# Patient Record
Sex: Female | Born: 1980 | Race: Black or African American | Hispanic: No | Marital: Single | Smoking: Former smoker
Health system: Southern US, Community
[De-identification: ages and names within clinical notes are randomized; demographics above are authoritative.]

## PROBLEM LIST (undated history)

## (undated) ENCOUNTER — Inpatient Hospital Stay (HOSPITAL_COMMUNITY): Payer: Self-pay

## (undated) DIAGNOSIS — O24419 Gestational diabetes mellitus in pregnancy, unspecified control: Secondary | ICD-10-CM

## (undated) DIAGNOSIS — Z8619 Personal history of other infectious and parasitic diseases: Secondary | ICD-10-CM

## (undated) DIAGNOSIS — D649 Anemia, unspecified: Secondary | ICD-10-CM

## (undated) DIAGNOSIS — J45909 Unspecified asthma, uncomplicated: Secondary | ICD-10-CM

## (undated) DIAGNOSIS — O09529 Supervision of elderly multigravida, unspecified trimester: Secondary | ICD-10-CM

## (undated) DIAGNOSIS — O34219 Maternal care for unspecified type scar from previous cesarean delivery: Secondary | ICD-10-CM

## (undated) HISTORY — DX: Personal history of other infectious and parasitic diseases: Z86.19

## (undated) HISTORY — DX: Supervision of elderly multigravida, unspecified trimester: O09.529

## (undated) HISTORY — DX: Anemia, unspecified: D64.9

## (undated) HISTORY — DX: Gestational diabetes mellitus in pregnancy, unspecified control: O24.419

---
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
## 2006-03-02 ENCOUNTER — Other Ambulatory Visit: Admission: RE | Admit: 2006-03-02 | Discharge: 2006-03-02 | Payer: Self-pay | Admitting: Obstetrics and Gynecology

## 2006-07-05 ENCOUNTER — Encounter: Admission: RE | Admit: 2006-07-05 | Discharge: 2006-07-05 | Payer: Self-pay | Admitting: Obstetrics and Gynecology

## 2006-08-05 ENCOUNTER — Inpatient Hospital Stay (HOSPITAL_COMMUNITY): Admission: RE | Admit: 2006-08-05 | Discharge: 2006-08-08 | Payer: Self-pay | Admitting: Obstetrics and Gynecology

## 2009-03-04 ENCOUNTER — Emergency Department (HOSPITAL_COMMUNITY): Admission: EM | Admit: 2009-03-04 | Discharge: 2009-03-04 | Payer: Self-pay | Admitting: Emergency Medicine

## 2010-04-23 ENCOUNTER — Emergency Department (HOSPITAL_COMMUNITY): Admission: EM | Admit: 2010-04-23 | Discharge: 2010-04-23 | Payer: Self-pay | Admitting: Emergency Medicine

## 2011-01-12 LAB — WET PREP, GENITAL
Trich, Wet Prep: NONE SEEN
Yeast Wet Prep HPF POC: NONE SEEN

## 2011-01-12 LAB — URINALYSIS, ROUTINE W REFLEX MICROSCOPIC
Bilirubin Urine: NEGATIVE
Glucose, UA: NEGATIVE mg/dL
Hgb urine dipstick: NEGATIVE
Nitrite: NEGATIVE
Urobilinogen, UA: 1 mg/dL (ref 0.0–1.0)

## 2011-01-12 LAB — RPR: RPR Ser Ql: NONREACTIVE

## 2011-03-13 NOTE — Discharge Summary (Signed)
NAME:  Carla Marquez, Carla Marquez             ACCOUNT NO.:  [id number]   MEDICAL RECORD NO.:  [id number]            PATIENT TYPE:   LOCATION:                                F[facility]:  [facility]   PHYSICIAN:  Janine Limbo, M.D.DATE OF BIRTH:  06/15/1981   DATE OF ADMISSION:  DATE OF DISCHARGE:                                 DISCHARGE SUMMARY   ADMISSION DIAGNOSIS:  Intrauterine pregnancy at 38-5/7 weeks. Desires repeat  C-section.   DISCHARGE DIAGNOSES:  Intrauterine pregnancy at 38-5/7 weeks delivered.  Repeat low transverse cesarean C-section.   PROCEDURE:  Low transverse cesarean section.   HOSPITAL COURSE:  1. [name] is a 30 year old gravida 3, para 1-0-1-1, who was admitted      at 38-5/7 weeks for repeat low transverse cesarean section. The patient      had been remarkable for previous C-section, failure to progress.  2. History of cervical cryosurgery seven years ago.  3. History of asthma without medication. The patient had elected to      undergo a repeat low transverse cesarean section versus VBAC. Also      pregnancy has been remarkable for empiric diabetes with no insulin      required. The patient underwent a repeat low transverse cesarean      section and delivered a viable female infant. [name] was Apgars of 8      and 9 at 1 and 5 minutes, respectively, and weight 7 pounds 15 ounces.      Surgery was uncomplicated. Estimated blood loss 700 cc. On      postoperative day #1 the patient's hemoglobin was noted to be 7.      However, patient was asymptomatic with orthostatic vital signs within      normal limits. The patient with no syncope. The patient was offered      transfusion of blood secondary to anemia. However, she declined. The      patient is breastfeeding. By day #3 postoperatively the patient was      doing well and would seem to have received full benefit of her hospital      stay. She was discharged home.   DISCHARGE CONDITION:  Stable.   DISCHARGE  INSTRUCTIONS:  Per [facility] handout.   DISCHARGE MEDICATIONS:  1. Motrin 600 mg p.o. q.6 h. p.r.n. pain.  2. Tylox 1-2 tablets p.o. q.4-6 h. p.r.n. pain.  3. Prenatal vitamins 1 tab daily.  4. Tandem F 1 tab b.i.d.  5. Patient was given Depo-Provera 150 mg IM prior to discharge for      contraception.   DISCHARGE FOLLOWUP:  Will occur at six weeks postpartum at [facility]      [name], CNM      Janine Limbo, M.D.  Electronically Signed    NOS/MEDQ  D:  08/08/2006  T:  08/09/2006  Job:  [id number]

## 2011-03-13 NOTE — Op Note (Signed)
NAME:  Carla Marquez, Carla Marquez             ACCOUNT NO.:  [id number]   MEDICAL RECORD NO.:  [id number]          PATIENT TYPE:  INP   LOCATION:  9127                          [facility]ACILITY:  [facility]   PHYSICIAN:  Crist Fat. Rivard, M.D. DATE OF BIRTH:  [birth year]   DATE OF PROCEDURE:  08/05/2006  DATE OF DISCHARGE:                                 OPERATIVE REPORT   PREOPERATIVE DIAGNOSIS:  Intrauterine pregnancy at 38 weeks 5 days with  previous cesarean section, for elective repeat cesarean section.   POSTOPERATIVE DIAGNOSIS:  Intrauterine pregnancy at 38 weeks 5 days with  previous cesarean section, for elective repeat cesarean section.   ANESTHESIA:  Spinal, Burnett Corrente, M.D.   PROCEDURE:  Repeat low transverse cesarean section.   SURGEON:  Crist Fat. Rivard, M.D.   ASSISTANTRenaldo Reel. Latham, C.N.M.   ESTIMATED BLOOD LOSS:  700 mL.   PROCEDURE:  After being informed of the planned procedure with possible  complications including bleeding, infection, injury to bowel, bladder or  ureters, informed consent is obtained.  The patient is taken to cesarean  suite, given spinal anesthesia by Dr. Harvest Forest without complication.  She is  placed in the dorsal decubitus position, pelvis tilted to the left, prepped  and draped in a sterile fashion, and a Foley catheter is inserted in her  bladder.   After assessing adequate level of anesthesia, infiltrate the previous  Pfannenstiel incision using 20 mL of Marcaine 0.25 and we perform a  Pfannenstiel incision, which is brought down sharply to the fascia.  Fascia  is then incised in a low transverse fashion.  Linea alba is dissected with  some difficulty.  We note at this time adhesions between the abdominal wall  and the midbody of the uterus.  Those are sharply dissected progressively  until we are able to identify the lower uterine segment.  We are unable to  identify a bladder flap. But the bladder is easily visualized and retracted  downward, where  we can now safely open the myometrium in a low transverse  fashion, first with knife, then extended bluntly.  Amniotic fluid is clear.  We assist the birth of a female infant at 12:48 p.m. in vertex presentation.  Mouth and nose are suctioned.  Cord is clamped with two Kelly clamps and  sectioned, and the baby is given to the pediatrician, Dr. Katrinka Blazing, present in  the room.  Twenty milliliters of blood is drawn from the umbilical vein, and  the placenta is allowed to deliver spontaneously.  It is complete, cord has  three vessels, and placenta is sent to labor and delivery.  Uterine revision  is negative.  At this time we can identify other adhesions between at the  left lateral pelvic wall and the uterus, and those are sharply dissected,  and this now releases completely the bladder flap.  The myometrium is then  closed in two layers. first with a running locked suture of 0 Vicryl, then  with a Lembert suture of 0 Vicryl imbricating the first one.  A figure-of-  eight stitch of 0 Vicryl is placed midline  to control some bleeding.  Both  paracolic gutters are cleaned, both tubes and ovaries assessed and normal,  and the pelvis is profusely irrigated with warm saline to confirm a  satisfactory hemostasis.  The bladder is investigated in detail, is intact,  and urine has remained clear throughout the procedure.  Because of the  diagnosis of pelvic adhesions, a sheet of Interceed is placed over the  uterine incision.  Under-fascia hemostasis is completed with cautery and  then the fascia is closed with two running sutures #1 Vicryl meeting  midline.  The wound is then irrigated with warm saline, hemostasis is  completed with cautery, and the incision is closed with a subcuticular  suture of 3-0 Monocryl and Steri-Strips.   Instrument and sponge count is complete x2.  Estimated blood loss is 700 mL.  A girl whose name is Carla Marquez, was born at 12:48 p.m., weight 7 pounds 15  ounces, and received an  Apgar of 8 at one minute and 9 at five minutes.   The procedure was very well-tolerated by the patient, who is taken to  recovery room in a well and stable condition.      Crist Fat Rivard, M.D.  Electronically Signed     SAR/MEDQ  D:  08/05/2006  T:  08/07/2006  Job:  [id number]

## 2011-03-13 NOTE — H&P (Signed)
NAME:  Carla Marquez, Carla Marquez             ACCOUNT NO.:  [id number]   MEDICAL RECORD NO.:  [id number]          PATIENT TYPE:  INP   LOCATION:  NA                            [facility]   PHYSICIAN:  Dois Davenport A. Rivard, M.D. DATE OF BIRTH:  [birth year]   DATE OF ADMISSION:  08/05/2006  DATE OF DISCHARGE:                                HISTORY & PHYSICAL   Ms. Mace is a 30 year old gravida 3, para 1-0-1-1, at 63 and 5/7 weeks who  presents on August 05, 2006, for scheduled repeat cesarean section.  The  patient's history is remarkable for:  1. Previous cesarean section with desire for repeat.  2. Impaired glucose tolerance, no insulin required.  3. History of cryosurgery and abnormal Pap in 2000.  4. History of asthma on no medications.   Prenatal labs reveal blood type O positive, Rh antibody negative, VDRL  nonreactive, Rubella titer positive, hepatitis B surface antigen negative,  HIV nonreactive, quadruple screen was normal, cystic fibrosis testing was  negative, GC and Chlamydia cultures were negative in May.  Pap was normal in  May.  Sickle cell test was negative in a past pregnancy.  Hemoglobin upon  entering the practice was 12.1.  It was 10.9 at 27 weeks.  EDC of August 14, 2006, was established by ultrasound at 16 weeks secondary to  questionable LMP.  The patient had a Glucola at 27 weeks that was elevated  at 196.  She had a three hour GTT that had one abnormal value at the three  hour time period.  She had a fasting blood sugar at 32 weeks of 100 and a  two hour post prandial of 144.  She was referred to [facility] at that time for diet education and blood sugar testing secondary  to impaired glucose tolerance.  Group B Strep culture, GC and Chlamydia  cultures were negative at 35 weeks.   HISTORY OF PRESENT PREGNANCY:  The patient entered care at approximately 14  weeks.  She was treated for BV at that first visit.  She had an ultrasound  secondary  to size greater than dates at 16 weeks which gave an [facility] of  August 14, 2006, no praevia was noted.  Growth was normal.  She planned a  repeat C-section initially, then decided she wanted a VBAC, then went back  to desiring repeat C-section.  Her Glucola was elevated at 186.  She had a  three hour GGT that had one abnormal value at 3 hours.  Fasting blood sugar  was done at 32 weeks which was 100 and a two hour post prandial was 144.  She was then referred to [facility] for diet education  and blood sugar testing secondary to impaired glucose tolerance.  From that  point on, her pregnancy was essentially uncomplicated.  She never required  any medication.  Her beta Strep and GC and Chlamydia cultures were negative  at 35 weeks.   OBSTETRICAL HISTORY:  In 2001, she had a termination of pregnancy at six  weeks.  In 2006, February, she had a  primary low transverse cesarean section  for a female infant, weight 6 pounds 8 ounces at 40 weeks.  She was in labor  12 hours.  She had spinal anesthesia.  This was for failure to progress and  this baby was delivered in [place].  This was a different paternity for  her current pregnancy.   MEDICAL HISTORY:  She is a previous Depo-Provera and oral contraceptive  user.  She had parotid surgery at age 28.  Her last Pap prior to pregnancy  was in April 2006.  She reports the usual childhood illnesses.  She did have  a history of Chlamydia and Trichomonas in the past.  She has had one UTI.   ALLERGIES:  She has no known medication allergies.   FAMILY HISTORY:  Her paternal aunt, paternal grandmother, and mother have  hypertension.  Her paternal aunt also had diabetes.  Her sister had some  type of thyroid problem.  She has a paternal uncle who is an alcoholic.   GENETIC HISTORY:  Remarkable for the patient's mother having some type of  heart murmur.   SOCIAL HISTORY:  The patient is single, father of the baby is involved and   supportive, his name is Annye Rusk.  The patient is [place] of  the [place] faith.  She has a high school education.  She is currently  unemployed as well as her partner.  She has been followed by the physician  service at [facility].  She denies any alcohol, drug, or tobacco  use during this pregnancy.   REVIEW OF SYSTEMS:  The patient denies any headache, visual symptoms, upper  epigastric pain.  Her condition is consistent with a full term gravid  individual.   PHYSICAL EXAMINATION:  VITAL SIGNS:  Stable.  HEENT:  Within normal limits.  LUNGS:  Breath sounds are clear.  HEART:  Regular rate and rhythm without murmur.  BREASTS:  Soft, nontender.  ABDOMEN:  Fundal height is approximately 38 cm.  Estimated fetal weight is 7-  8 pounds.  Uterine contractions are very occasionally mild.  Cervix on  October 10 was closed, long, with the vertex at a -1 station.  Fetal heart  rate is in the 140s by Doppler in the office.  EXTREMITIES:  Deep tendon reflexes are 2+ without clonus, there is trace  edema noted.   ASSESSMENT:  1. Intrauterine pregnancy at 50 and 5/7 weeks.  2. Previous cesarean section with desire for repeat.  3. Negative group B Strep.  4. History of glucose intolerance, but no insulin required.   PLAN:  1. Admit to the [facility] per consult with Dr. Silverio Lay, the attending physician.  2. Routine physician preoperative orders.      Renaldo Reel Emilee Hero, C.N.M.      Crist Fat Rivard, M.D.  Electronically Signed    VLL/MEDQ  D:  08/04/2006  T:  08/04/2006  Job:  [id number]

## 2011-08-07 ENCOUNTER — Inpatient Hospital Stay (INDEPENDENT_AMBULATORY_CARE_PROVIDER_SITE_OTHER)
Admission: RE | Admit: 2011-08-07 | Discharge: 2011-08-07 | Disposition: A | Discharge status: Home / Self Care | Payer: Self-pay | Source: Ambulatory Visit | Attending: Emergency Medicine | Admitting: Emergency Medicine

## 2011-08-07 DIAGNOSIS — A5901 Trichomonal vulvovaginitis: Secondary | ICD-10-CM

## 2011-08-07 LAB — POCT PREGNANCY, URINE: Preg Test, Ur: NEGATIVE

## 2011-08-07 LAB — WET PREP, GENITAL: Yeast Wet Prep HPF POC: NONE SEEN

## 2011-08-07 LAB — POCT URINALYSIS DIP (DEVICE)
Bilirubin Urine: NEGATIVE
Ketones, ur: NEGATIVE mg/dL
Urobilinogen, UA: 4 mg/dL — ABNORMAL HIGH (ref 0.0–1.0)

## 2011-08-14 ENCOUNTER — Inpatient Hospital Stay (INDEPENDENT_AMBULATORY_CARE_PROVIDER_SITE_OTHER)
Admission: RE | Admit: 2011-08-14 | Discharge: 2011-08-14 | Disposition: A | Discharge status: Home / Self Care | Payer: Self-pay | Source: Ambulatory Visit | Attending: Emergency Medicine | Admitting: Emergency Medicine

## 2011-08-14 DIAGNOSIS — A549 Gonococcal infection, unspecified: Secondary | ICD-10-CM

## 2012-01-28 IMAGING — CR DG LUMBAR SPINE COMPLETE 4+V
5 series · 5 of 5 positions shown · non-contrast
Comparison: CT abdomen pelvis 03/04/2009

CLINICAL DATA: Low back pain radiating down both legs.

LUMBAR SPINE - COMPLETE 4+ VIEW

[t l-spine a.p.]
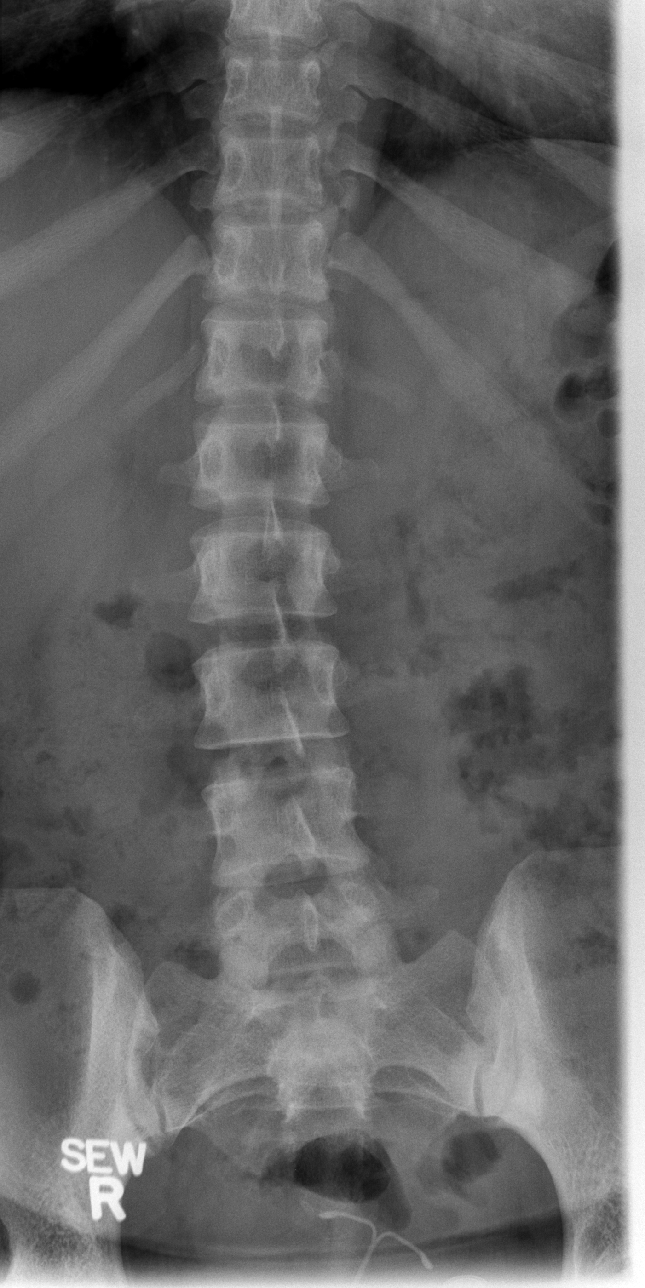

[t l-spine oblique exposure (1 of 2)]
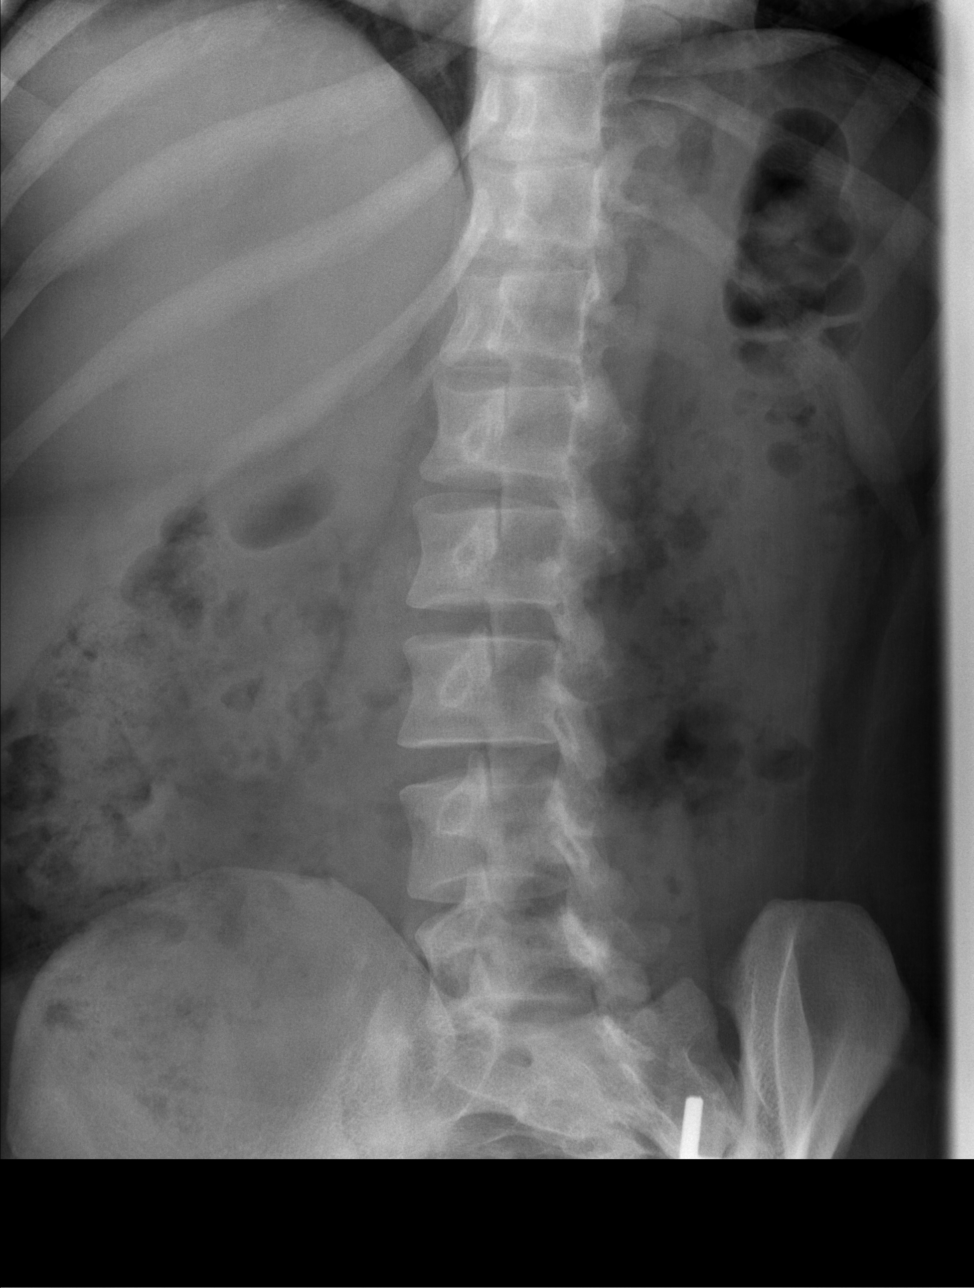

[t l-spine oblique exposure (2 of 2)]
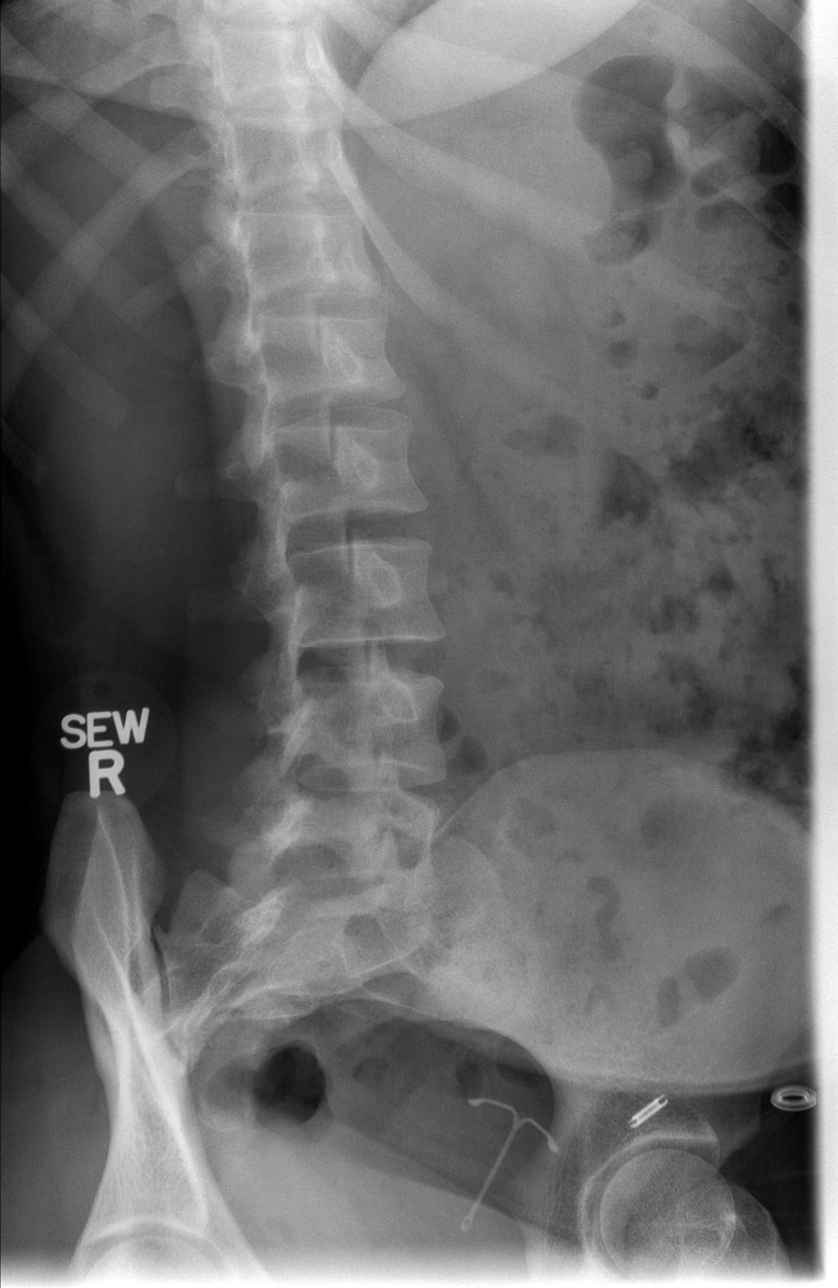

[t l-spine lat]
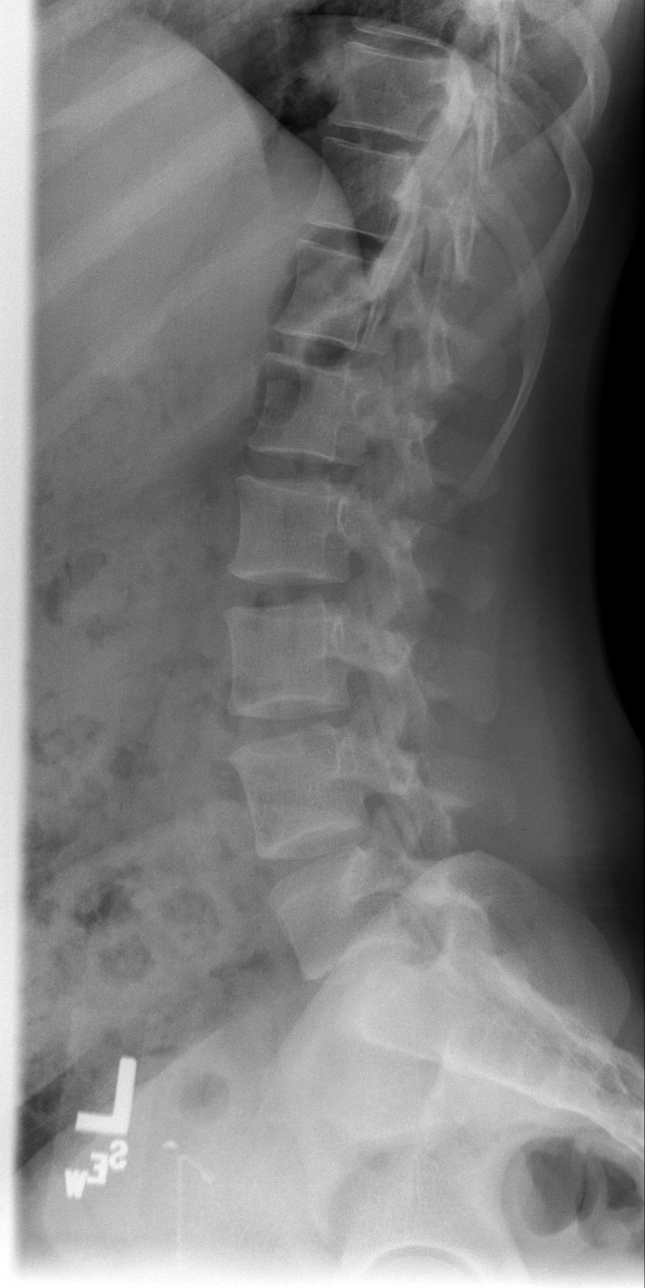

[t l-spine l5-s1 spot]
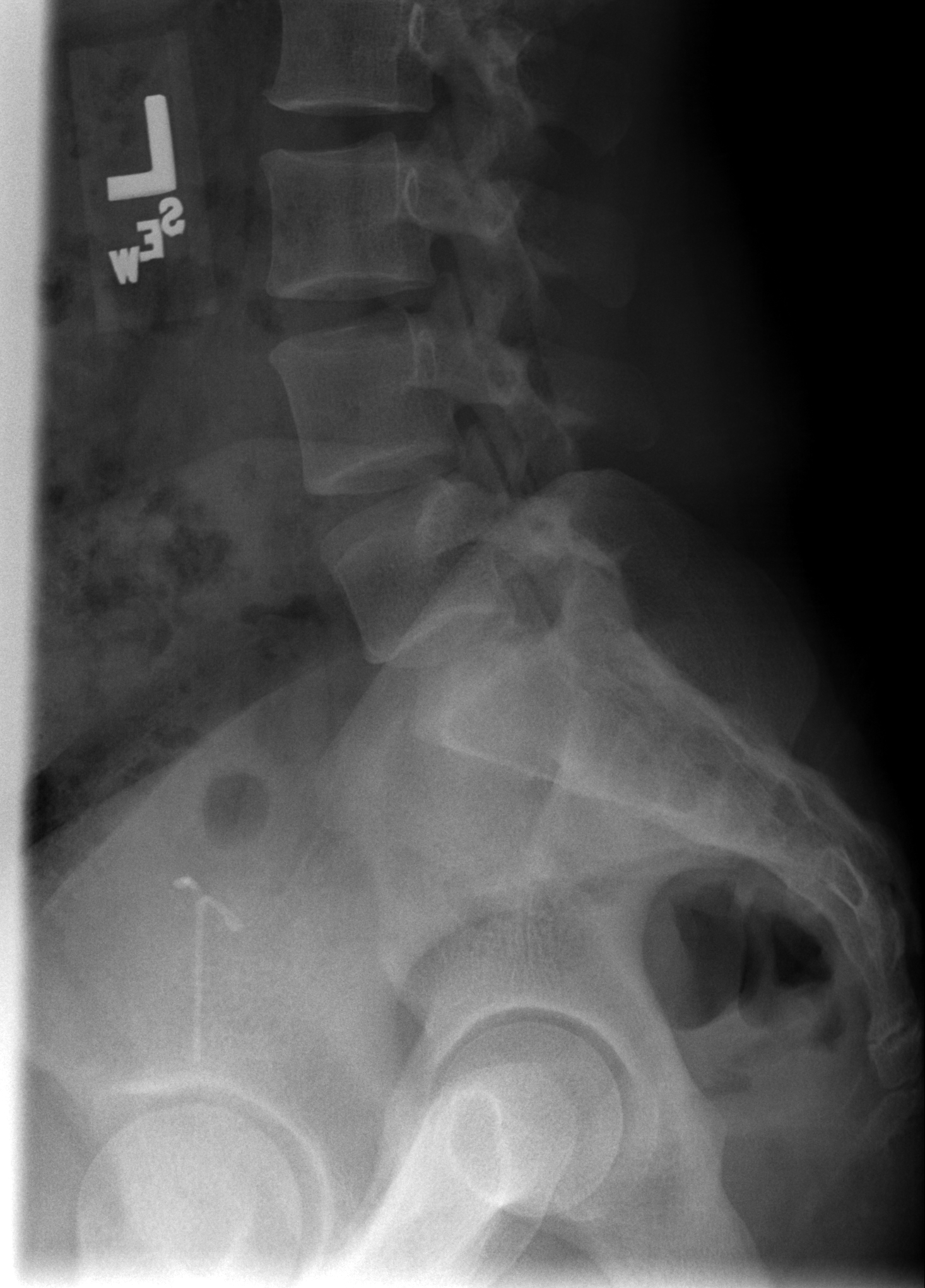

[5 of 5 positions shown; findings below may reference images not displayed]

FINDINGS: Slight rightward scoliosis of the lumbar spine.  There
are five lumbar-type vertebral bodies.  Disc spaces are maintained.
Visualized bony pelvis is unremarkable.  IUD is in place.
IMPRESSION: No acute findings.

## 2014-01-23 ENCOUNTER — Encounter (HOSPITAL_COMMUNITY): Payer: Self-pay | Admitting: Emergency Medicine

## 2014-01-23 ENCOUNTER — Emergency Department (INDEPENDENT_AMBULATORY_CARE_PROVIDER_SITE_OTHER)
Admission: EM | Admit: 2014-01-23 | Discharge: 2014-01-23 | Disposition: A | Discharge status: Home / Self Care | Payer: 59 | Source: Home / Self Care | Attending: Family Medicine | Admitting: Family Medicine

## 2014-01-23 DIAGNOSIS — J00 Acute nasopharyngitis [common cold]: Secondary | ICD-10-CM

## 2014-01-23 HISTORY — DX: Unspecified asthma, uncomplicated: J45.909

## 2014-01-23 LAB — POCT RAPID STREP A: STREPTOCOCCUS, GROUP A SCREEN (DIRECT): NEGATIVE

## 2014-01-23 NOTE — ED Notes (Signed)
Pt c/o cold sxs onset Sunday Sxs include: ST, right ear pain, BA, congestion Taking Theraflu and benadryl w/no relief Denies f/v/n/d, SOB, wheezing... Hx of asthma Alert w/no signs of acute distress.

## 2014-01-23 NOTE — ED Provider Notes (Signed)
CSN: [id number]     Arrival date & time 01/23/14  [id number] History   First MD Initiated Contact with Patient 01/23/14 [phone]     Chief Complaint  Patient presents with  . URI   (Consider location/radiation/quality/duration/timing/severity/associated sxs/prior Treatment) Patient is a 33 y.o. female presenting with URI. The history is provided by the patient.  URI Presenting symptoms: congestion, cough, ear pain and sore throat   Presenting symptoms: no fever and no rhinorrhea   Severity:  Mild Onset quality:  Gradual Duration:  2 days Timing:  Constant Progression:  Unchanged Chronicity:  New Associated symptoms: myalgias   Associated symptoms: no arthralgias, no headaches, no neck pain, no sinus pain, no sneezing, no swollen glands and no wheezing     Past Medical History  Diagnosis Date  . Asthma    History reviewed. No pertinent past surgical history. No family history on file. History  Substance Use Topics  . Smoking status: Never Smoker   . Smokeless tobacco: Not on file  . Alcohol Use: No   OB History   Grav Para Term Preterm Abortions TAB SAB Ect Mult Living                 Review of Systems  Constitutional: Negative for fever.  HENT: Positive for congestion, ear pain and sore throat. Negative for rhinorrhea and sneezing.   Respiratory: Positive for cough. Negative for wheezing.   Musculoskeletal: Positive for myalgias. Negative for arthralgias and neck pain.  Neurological: Negative for headaches.  All other systems reviewed and are negative.    Allergies  Review of patient's allergies indicates no known allergies.  Home Medications  No current outpatient prescriptions on file. BP 119/80  Pulse 90  Temp(Src) 98.5 F (36.9 C) (Oral)  Resp 16  SpO2 99%  LMP 01/18/2014 Physical Exam  Nursing note and vitals reviewed. Constitutional: She is oriented to person, place, and time. She appears well-developed and well-nourished. No distress.  HENT:  Head:  Normocephalic and atraumatic.  Right Ear: Hearing, tympanic membrane, external ear and ear canal normal.  Left Ear: Hearing, tympanic membrane, external ear and ear canal normal.  Nose: Nose normal.  Mouth/Throat: Uvula is midline, oropharynx is clear and moist and mucous membranes are normal.  Eyes: Conjunctivae are normal. Right eye exhibits no discharge. Left eye exhibits no discharge. No scleral icterus.  Neck: Normal range of motion. Neck supple. No thyromegaly present.  Cardiovascular: Normal rate, regular rhythm and normal heart sounds.   Pulmonary/Chest: Effort normal and breath sounds normal.  Musculoskeletal: Normal range of motion.  Lymphadenopathy:    She has no cervical adenopathy.  Neurological: She is alert and oriented to person, place, and time.  Skin: Skin is warm and dry. No rash noted.  Psychiatric: She has a normal mood and affect. Her behavior is normal.    ED Course  Procedures (including critical care time) Labs Review Labs Reviewed  POCT RAPID STREP A ([facility] CARE ONLY)   Imaging Review No results found.   MDM   1. Common cold   Symptomatic care at home. Rapid Strep negative.    Jess BartersJennifer Lee BangsPresson, GeorgiaPA 01/23/14 [phone]

## 2014-01-23 NOTE — Discharge Instructions (Signed)
Your strep test was negative. Tylenol, fluids, rest as you need to. Warm salt water gargles for throat.

## 2014-01-24 NOTE — ED Provider Notes (Signed)
Medical screening examination/treatment/procedure(s) were performed by resident physician or non-physician practitioner and as supervising physician I was immediately available for consultation/collaboration.   Moani Weipert DOUGLAS MD.   Marieanne Marxen D Tanetta Fuhriman, MD 01/24/14 2046 

## 2014-01-25 LAB — CULTURE, GROUP A STREP

## 2014-06-14 LAB — OB RESULTS CONSOLE HEPATITIS B SURFACE ANTIGEN: HEP B S AG: NEGATIVE

## 2014-06-14 LAB — OB RESULTS CONSOLE GC/CHLAMYDIA
CHLAMYDIA, DNA PROBE: NEGATIVE
Gonorrhea: NEGATIVE

## 2014-06-14 LAB — OB RESULTS CONSOLE HIV ANTIBODY (ROUTINE TESTING): HIV: NONREACTIVE

## 2014-06-14 LAB — OB RESULTS CONSOLE ABO/RH: RH TYPE: POSITIVE

## 2014-06-14 LAB — OB RESULTS CONSOLE RPR: RPR: NONREACTIVE

## 2014-06-14 LAB — OB RESULTS CONSOLE RUBELLA ANTIBODY, IGM: Rubella: IMMUNE

## 2014-06-14 LAB — OB RESULTS CONSOLE ANTIBODY SCREEN: Antibody Screen: NEGATIVE

## 2014-10-26 HISTORY — DX: Maternal care for unspecified type scar from previous cesarean delivery: O34.219

## 2014-10-26 NOTE — L&D Delivery Note (Addendum)
Delivery Note At 1:57 AM a viable and healthy female was delivered via VBAC spontaneous (Presentation: Left Occiput Anterior). Left hand compound presentation  APGAR: 8, 9; weight  6lb 9 oz  Placenta status: Intact, Spontaneous. Not sent  Cord:  3 vessels with the following complications: None.  Cord pH: none  Anesthesia: Epidural Local  Episiotomy: None Lacerations: Periurethral( left) Suture Repair: 3.0 chromic Est. Blood Loss (mL): 250  Mom to postpartum.  Baby to Couplet care / Skin to Skin.  Cormick Moss A 01/20/2015, 2:19 AM

## 2014-10-31 ENCOUNTER — Encounter: Payer: 59 | Attending: Obstetrics and Gynecology

## 2014-10-31 VITALS — Ht 62.0 in | Wt 172.7 lb

## 2014-10-31 DIAGNOSIS — E119 Type 2 diabetes mellitus without complications: Secondary | ICD-10-CM | POA: Diagnosis present

## 2014-10-31 DIAGNOSIS — Z713 Dietary counseling and surveillance: Secondary | ICD-10-CM | POA: Insufficient documentation

## 2014-10-31 NOTE — Progress Notes (Signed)
  Patient was seen on 10/31/14 for Gestational Diabetes self-management . The following learning objectives were met by the patient :   States the definition of Gestational Diabetes  States why dietary management is important in controlling blood glucose  Describes the effects of carbohydrates on blood glucose levels  Demonstrates ability to create a balanced meal plan  Demonstrates carbohydrate counting   States when to check blood glucose levels  Demonstrates proper blood glucose monitoring techniques  States the effect of stress and exercise on blood glucose levels  States the importance of limiting caffeine and abstaining from alcohol and smoking  Plan:  Aim for 2 Carb Choices per meal (30 grams) +/- 1 either way for breakfast Aim for 3 Carb Choices per meal (45 grams) +/- 1 either way from lunch and dinner Aim for 1-2 Carbs per snack Begin reading food labels for Total Carbohydrate and sugar grams of foods Consider  increasing your activity level by walking daily as tolerated Begin checking BG before breakfast and 2 hours after first bit of breakfast, lunch and dinner after  as directed by MD  Take medication  as directed by MD  Blood glucose monitor using: Provided by [place] pregnancy program  Patient instructed to monitor glucose levels: FBS: 60 - <90 2 hour: <120  Patient received the following handouts:  Nutrition Diabetes and Pregnancy  Carbohydrate Counting List  Meal Planning worksheet  Patient will be seen for follow-up as needed.

## 2014-12-14 LAB — OB RESULTS CONSOLE GC/CHLAMYDIA
Chlamydia: NEGATIVE
Gonorrhea: NEGATIVE

## 2014-12-14 LAB — OB RESULTS CONSOLE GBS: GBS: NEGATIVE

## 2015-01-19 ENCOUNTER — Inpatient Hospital Stay (HOSPITAL_COMMUNITY)
Admission: AD | Admit: 2015-01-19 | Discharge: 2015-01-21 | DRG: 775 | Disposition: A | Discharge status: Home / Self Care | Payer: 59 | Source: Ambulatory Visit | Attending: Obstetrics and Gynecology | Admitting: Obstetrics and Gynecology

## 2015-01-19 ENCOUNTER — Encounter (HOSPITAL_COMMUNITY): Payer: Self-pay | Admitting: *Deleted

## 2015-01-19 DIAGNOSIS — O9081 Anemia of the puerperium: Secondary | ICD-10-CM | POA: Diagnosis present

## 2015-01-19 DIAGNOSIS — IMO0001 Reserved for inherently not codable concepts without codable children: Secondary | ICD-10-CM

## 2015-01-19 DIAGNOSIS — O480 Post-term pregnancy: Secondary | ICD-10-CM | POA: Diagnosis present

## 2015-01-19 DIAGNOSIS — J45909 Unspecified asthma, uncomplicated: Secondary | ICD-10-CM | POA: Diagnosis present

## 2015-01-19 DIAGNOSIS — O3421 Maternal care for scar from previous cesarean delivery: Secondary | ICD-10-CM | POA: Diagnosis present

## 2015-01-19 DIAGNOSIS — O9952 Diseases of the respiratory system complicating childbirth: Secondary | ICD-10-CM | POA: Diagnosis present

## 2015-01-19 DIAGNOSIS — Z3483 Encounter for supervision of other normal pregnancy, third trimester: Secondary | ICD-10-CM | POA: Diagnosis present

## 2015-01-19 DIAGNOSIS — O24420 Gestational diabetes mellitus in childbirth, diet controlled: Principal | ICD-10-CM | POA: Diagnosis present

## 2015-01-19 DIAGNOSIS — Z3A40 40 weeks gestation of pregnancy: Secondary | ICD-10-CM | POA: Diagnosis present

## 2015-01-19 DIAGNOSIS — O34219 Maternal care for unspecified type scar from previous cesarean delivery: Secondary | ICD-10-CM | POA: Diagnosis present

## 2015-01-19 LAB — GLUCOSE, CAPILLARY: Glucose-Capillary: 116 mg/dL — ABNORMAL HIGH (ref 70–99)

## 2015-01-19 LAB — CBC
HEMATOCRIT: 35.2 % — AB (ref 36.0–46.0)
Hemoglobin: 11.1 g/dL — ABNORMAL LOW (ref 12.0–15.0)
MCH: 26.3 pg (ref 26.0–34.0)
MCHC: 31.5 g/dL (ref 30.0–36.0)
MCV: 83.4 fL (ref 78.0–100.0)
Platelets: 247 10*3/uL (ref 150–400)
RBC: 4.22 MIL/uL (ref 3.87–5.11)
RDW: 16.9 % — ABNORMAL HIGH (ref 11.5–15.5)
WBC: 9.7 10*3/uL (ref 4.0–10.5)

## 2015-01-19 MED ORDER — ACETAMINOPHEN 325 MG PO TABS: 650.0000 mg | ORAL_TABLET | ORAL | Status: DC | PRN

## 2015-01-19 MED ORDER — LACTATED RINGERS IV SOLN
INTRAVENOUS | Status: DC
Start: 2015-01-19 — End: 2015-01-20
  Administered 2015-01-19: 23:00:00 via INTRAVENOUS

## 2015-01-19 MED ORDER — LIDOCAINE HCL (PF) 1 % IJ SOLN
30.0000 mL | INTRAMUSCULAR | Status: DC | PRN
  Administered 2015-01-20: 30 mL via SUBCUTANEOUS
  Filled 2015-01-19: qty 30

## 2015-01-19 MED ORDER — BUTORPHANOL TARTRATE 1 MG/ML IJ SOLN
1.0000 mg | INTRAMUSCULAR | Status: DC | PRN
  Administered 2015-01-19: 1 mg via INTRAVENOUS
  Filled 2015-01-19: qty 1

## 2015-01-19 MED ORDER — OXYTOCIN 40 UNITS IN LACTATED RINGERS INFUSION - SIMPLE MED
1.0000 m[IU]/min | INTRAVENOUS | Status: DC
  Administered 2015-01-19: 1 m[IU]/min via INTRAVENOUS
  Filled 2015-01-19: qty 1000

## 2015-01-19 MED ORDER — OXYTOCIN 40 UNITS IN LACTATED RINGERS INFUSION - SIMPLE MED: 62.5000 mL/h | INTRAVENOUS | Status: DC

## 2015-01-19 MED ORDER — OXYTOCIN 10 UNIT/ML IJ SOLN: 10.0000 [IU] | Freq: Once | INTRAMUSCULAR | Status: DC

## 2015-01-19 MED ORDER — OXYCODONE-ACETAMINOPHEN 5-325 MG PO TABS: 1.0000 | ORAL_TABLET | ORAL | Status: DC | PRN

## 2015-01-19 MED ORDER — TERBUTALINE SULFATE 1 MG/ML IJ SOLN: 0.2500 mg | Freq: Once | INTRAMUSCULAR | Status: AC | PRN

## 2015-01-19 MED ORDER — CITRIC ACID-SODIUM CITRATE 334-500 MG/5ML PO SOLN: 30.0000 mL | ORAL | Status: DC | PRN

## 2015-01-19 MED ORDER — LACTATED RINGERS IV SOLN: 500.0000 mL | INTRAVENOUS | Status: DC | PRN

## 2015-01-19 MED ORDER — OXYCODONE-ACETAMINOPHEN 5-325 MG PO TABS: 2.0000 | ORAL_TABLET | ORAL | Status: DC | PRN

## 2015-01-19 MED ORDER — ONDANSETRON HCL 4 MG/2ML IJ SOLN
4.0000 mg | Freq: Four times a day (QID) | INTRAMUSCULAR | Status: DC | PRN
  Administered 2015-01-19: 4 mg via INTRAVENOUS
  Filled 2015-01-19: qty 2

## 2015-01-19 MED ORDER — OXYTOCIN BOLUS FROM INFUSION: 500.0000 mL | INTRAVENOUS | Status: DC

## 2015-01-19 NOTE — H&P (Signed)
Carla Marquez is a 34 y.o. female presenting for labor evaluation @ 41 1/7 weeks. . (+) ctx since 8 am. Active fetus. Intact membrane. GBS cx neg. Prev C/S x 2 with successful  VBAC at home  Maternal Medical History:  Reason for admission: Contractions.   Contractions: Onset was 6-12 hours ago.   Frequency: irregular.   Perceived severity is moderate.    Fetal activity: Perceived fetal activity is normal.    Prenatal Complications - Diabetes: gestational. Diabetes is managed by diet.      OB History    Gravida Para Term Preterm AB TAB SAB Ectopic Multiple Living   5 3 3  1     3      Past Medical History  Diagnosis Date  . Asthma   . Gestational diabetes    Past Surgical History  Procedure Laterality Date  . Cesarean section     Family History: family history is not on file. Social History:  reports that she has never smoked. She does not have any smokeless tobacco history on file. She reports that she does not drink alcohol or use illicit drugs.   Prenatal Transfer Tool  Maternal Diabetes: Yes:  Diabetes Type:  Diet controlled Genetic Screening: Declined Maternal Ultrasounds/Referrals: Normal Fetal Ultrasounds or other Referrals:  None Maternal Substance Abuse:  No Significant Maternal Medications:  None Significant Maternal Lab Results:  Lab values include: Group B Strep negative Other Comments:  None  Review of Systems  All other systems reviewed and are negative.   Dilation: 1 Effacement (%): 100 Station: 0 Exam by:: Dr. Cherly Hensenousins Height 5\' 2"  (1.575 m), weight 78.472 kg (173 lb), last menstrual period 01/18/2014. Maternal Exam:  Uterine Assessment: Contraction strength is moderate.  Contraction frequency is irregular.   Abdomen: Patient reports no abdominal tenderness. Surgical scars: low transverse.   Estimated fetal weight is 6 1/2 lb.   Fetal presentation: vertex  Introitus: Amniotic fluid character: not assessed.  Pelvis: adequate for delivery.    Cervix: Cervix evaluated by digital exam.     Fetal Exam Fetal Monitor Review: Variability: moderate (6-25 bpm).   Pattern: variable decelerations.    Fetal State Assessment: Category II - tracings are indeterminate.     Physical Exam  Constitutional: She is oriented to person, place, and time. She appears well-developed and well-nourished.  HENT:  Head: Atraumatic.  Eyes: EOM are normal.  Neck: Neck supple.  Cardiovascular: Normal rate and regular rhythm.   Respiratory: Breath sounds normal.  GI: Soft.  Musculoskeletal: She exhibits no edema.  Neurological: She is alert and oriented to person, place, and time.  Skin: Skin is warm and dry.  Psychiatric: She has a normal mood and affect.   VE 1/100/0 station post large nabothian  cyst  Intracervical balloon placed  Prenatal labs: ABO, Rh: O/Positive/-- (08/20 0000) Antibody: Negative (08/20 0000) Rubella: Immune (08/20 0000) RPR: Nonreactive (08/20 0000)  HBsAg: Negative (08/20 0000)  HIV: Non-reactive (08/20 0000)  GBS: Negative (02/19 0000)   Assessment/Plan: Latent phase labor Postdates Prev C/S x 2 desires repeat VBAC Class a1 GDM P) admit routine labs. Intracervical balloon( already placed). Pitocin. Analgesic prn( pt declines epidural). BS q 4 hr  Keiaira Donlan A 01/19/2015, 10:06 PM

## 2015-01-19 NOTE — MAU Note (Signed)
PT REPORTS SHE STARTED HAVING CTX EVERY 4-5 MIN. FEELING VERY UNCOMFORTABLE. gOOD FETAL MOVEMENT REPORTED DENIES srom OR BLEEDING AT THIS TIME

## 2015-01-20 ENCOUNTER — Inpatient Hospital Stay (HOSPITAL_COMMUNITY): Payer: 59 | Admitting: Anesthesiology

## 2015-01-20 ENCOUNTER — Encounter (HOSPITAL_COMMUNITY): Payer: Self-pay | Admitting: *Deleted

## 2015-01-20 DIAGNOSIS — O34219 Maternal care for unspecified type scar from previous cesarean delivery: Secondary | ICD-10-CM | POA: Diagnosis not present

## 2015-01-20 LAB — CBC
HCT: 31.3 % — ABNORMAL LOW (ref 36.0–46.0)
HEMOGLOBIN: 10 g/dL — AB (ref 12.0–15.0)
MCH: 26.7 pg (ref 26.0–34.0)
MCHC: 31.9 g/dL (ref 30.0–36.0)
MCV: 83.5 fL (ref 78.0–100.0)
Platelets: 235 10*3/uL (ref 150–400)
RBC: 3.75 MIL/uL — AB (ref 3.87–5.11)
RDW: 16.9 % — AB (ref 11.5–15.5)
WBC: 14.8 10*3/uL — AB (ref 4.0–10.5)

## 2015-01-20 LAB — TYPE AND SCREEN
ABO/RH(D): O POS
Antibody Screen: NEGATIVE

## 2015-01-20 LAB — RPR: RPR Ser Ql: NONREACTIVE

## 2015-01-20 LAB — ABO/RH: ABO/RH(D): O POS

## 2015-01-20 MED ORDER — FENTANYL 2.5 MCG/ML BUPIVACAINE 1/10 % EPIDURAL INFUSION (WH - ANES)
INTRAMUSCULAR | Status: AC
  Filled 2015-01-20: qty 125

## 2015-01-20 MED ORDER — FENTANYL 2.5 MCG/ML BUPIVACAINE 1/10 % EPIDURAL INFUSION (WH - ANES)
14.0000 mL/h | INTRAMUSCULAR | Status: DC | PRN
  Administered 2015-01-20: 14 mL/h via EPIDURAL

## 2015-01-20 MED ORDER — WITCH HAZEL-GLYCERIN EX PADS: 1.0000 "application " | MEDICATED_PAD | CUTANEOUS | Status: DC | PRN

## 2015-01-20 MED ORDER — ONDANSETRON HCL 4 MG/2ML IJ SOLN: 4.0000 mg | INTRAMUSCULAR | Status: DC | PRN

## 2015-01-20 MED ORDER — LACTATED RINGERS IV SOLN: INTRAVENOUS | Status: DC

## 2015-01-20 MED ORDER — SIMETHICONE 80 MG PO CHEW: 80.0000 mg | CHEWABLE_TABLET | ORAL | Status: DC | PRN

## 2015-01-20 MED ORDER — DIPHENHYDRAMINE HCL 50 MG/ML IJ SOLN: 12.5000 mg | INTRAMUSCULAR | Status: DC | PRN

## 2015-01-20 MED ORDER — EPHEDRINE 5 MG/ML INJ
10.0000 mg | INTRAVENOUS | Status: DC | PRN
  Filled 2015-01-20: qty 2

## 2015-01-20 MED ORDER — PRENATAL MULTIVITAMIN CH
1.0000 | ORAL_TABLET | Freq: Every day | ORAL | Status: DC
  Administered 2015-01-20 – 2015-01-21 (×2): 1 via ORAL
  Filled 2015-01-20 (×2): qty 1

## 2015-01-20 MED ORDER — ACETAMINOPHEN 325 MG PO TABS
650.0000 mg | ORAL_TABLET | ORAL | Status: DC | PRN
  Administered 2015-01-20: 650 mg via ORAL
  Filled 2015-01-20: qty 2

## 2015-01-20 MED ORDER — PHENYLEPHRINE 40 MCG/ML (10ML) SYRINGE FOR IV PUSH (FOR BLOOD PRESSURE SUPPORT)
80.0000 ug | PREFILLED_SYRINGE | INTRAVENOUS | Status: DC | PRN
  Filled 2015-01-20: qty 2

## 2015-01-20 MED ORDER — LACTATED RINGERS IV SOLN
500.0000 mL | Freq: Once | INTRAVENOUS | Status: AC
  Administered 2015-01-20: 500 mL via INTRAVENOUS

## 2015-01-20 MED ORDER — FERROUS SULFATE 325 (65 FE) MG PO TABS
325.0000 mg | ORAL_TABLET | Freq: Two times a day (BID) | ORAL | Status: DC
  Administered 2015-01-20 – 2015-01-21 (×3): 325 mg via ORAL
  Filled 2015-01-20 (×3): qty 1

## 2015-01-20 MED ORDER — DIBUCAINE 1 % RE OINT: 1.0000 "application " | TOPICAL_OINTMENT | RECTAL | Status: DC | PRN

## 2015-01-20 MED ORDER — BENZOCAINE-MENTHOL 20-0.5 % EX AERO: 1.0000 "application " | INHALATION_SPRAY | CUTANEOUS | Status: DC | PRN

## 2015-01-20 MED ORDER — LANOLIN HYDROUS EX OINT: TOPICAL_OINTMENT | CUTANEOUS | Status: DC | PRN

## 2015-01-20 MED ORDER — SENNOSIDES-DOCUSATE SODIUM 8.6-50 MG PO TABS
2.0000 | ORAL_TABLET | ORAL | Status: DC
  Administered 2015-01-20: 2 via ORAL
  Filled 2015-01-20: qty 2

## 2015-01-20 MED ORDER — ONDANSETRON HCL 4 MG PO TABS: 4.0000 mg | ORAL_TABLET | ORAL | Status: DC | PRN

## 2015-01-20 MED ORDER — OXYCODONE-ACETAMINOPHEN 5-325 MG PO TABS
1.0000 | ORAL_TABLET | ORAL | Status: DC | PRN
  Administered 2015-01-20 – 2015-01-21 (×2): 1 via ORAL
  Filled 2015-01-20 (×2): qty 1

## 2015-01-20 MED ORDER — PHENYLEPHRINE 40 MCG/ML (10ML) SYRINGE FOR IV PUSH (FOR BLOOD PRESSURE SUPPORT)
PREFILLED_SYRINGE | INTRAVENOUS | Status: AC
  Filled 2015-01-20: qty 20

## 2015-01-20 MED ORDER — IBUPROFEN 600 MG PO TABS
600.0000 mg | ORAL_TABLET | Freq: Four times a day (QID) | ORAL | Status: DC
  Administered 2015-01-20 – 2015-01-21 (×6): 600 mg via ORAL
  Filled 2015-01-20 (×6): qty 1

## 2015-01-20 MED ORDER — LIDOCAINE HCL (PF) 1 % IJ SOLN
INTRAMUSCULAR | Status: DC | PRN
  Administered 2015-01-20: 3 mL
  Administered 2015-01-20 (×2): 5 mL

## 2015-01-20 MED ORDER — DIPHENHYDRAMINE HCL 25 MG PO CAPS: 25.0000 mg | ORAL_CAPSULE | Freq: Four times a day (QID) | ORAL | Status: DC | PRN

## 2015-01-20 MED ORDER — OXYCODONE-ACETAMINOPHEN 5-325 MG PO TABS: 2.0000 | ORAL_TABLET | ORAL | Status: DC | PRN

## 2015-01-20 MED ORDER — ZOLPIDEM TARTRATE 5 MG PO TABS: 5.0000 mg | ORAL_TABLET | Freq: Every evening | ORAL | Status: DC | PRN

## 2015-01-20 MED ORDER — FENTANYL 2.5 MCG/ML BUPIVACAINE 1/10 % EPIDURAL INFUSION (WH - ANES)
INTRAMUSCULAR | Status: DC | PRN
  Administered 2015-01-20: 14 mL/h via EPIDURAL

## 2015-01-20 NOTE — Progress Notes (Signed)
Patient ID: Harvie Junioroymisha R Hannon, female   DOB: 20-Jan-1981, 34 y.o.   MRN: [id number] INTERVAL NOTE:  S:   Sitting in bed, infant skin-to-skin, min cramping, (+) voids, small bleed, denies HA/NV/dizziness  O:   VSS, AAO x 3, NAD  U even  Scant lochia  Blood Sugar: 116 mg/dL (last night @ [id number])  A / P:   PPD #0 / [id number] / S/P VBAC with periurethral laceration  A1 GDM - stable  Stable post partum  Routine PP orders  Kenard GowerAWSON, Zeola Brys, M, MSN, CNM 01/20/2015, 10:19 AM

## 2015-01-20 NOTE — Anesthesia Procedure Notes (Signed)
Epidural Patient location during procedure: OB  Staffing Anesthesiologist: Verdie Barrows Performed by: anesthesiologist   Preanesthetic Checklist Completed: patient identified, site marked, surgical consent, pre-op evaluation, timeout performed, IV checked, risks and benefits discussed and monitors and equipment checked  Epidural Patient position: sitting Prep: ChloraPrep Patient monitoring: heart rate, continuous pulse ox and blood pressure Approach: right paramedian Location: L3-L4 Injection technique: LOR saline  Needle:  Needle type: Tuohy  Needle gauge: 17 G Needle length: 9 cm and 9 Needle insertion depth: 6 cm Catheter type: closed end flexible Catheter size: 20 Guage Catheter at skin depth: 10 cm Test dose: negative  Assessment Events: blood not aspirated, injection not painful, no injection resistance, negative IV test and no paresthesia  Additional Notes   Patient tolerated the insertion well without complications.   

## 2015-01-20 NOTE — Anesthesia Postprocedure Evaluation (Signed)
  Anesthesia Post-op Note  Patient: Carla Marquez  Procedure(s) Performed: * No procedures listed *  Patient Location: Mother/Baby  Anesthesia Type:Epidural  Level of Consciousness: awake  Airway and Oxygen Therapy: Patient Spontanous Breathing  Post-op Pain: mild  Post-op Assessment: Patient's Cardiovascular Status Stable and Respiratory Function Stable  Post-op Vital Signs: stable  Last Vitals:  Filed Vitals:   01/20/15 0915  BP: 104/48  Pulse: 76  Temp: 36.5 C  Resp: 18    Complications: No apparent anesthesia complications

## 2015-01-20 NOTE — Anesthesia Preprocedure Evaluation (Signed)
Anesthesia Evaluation  Patient identified by MRN, date of birth, ID band Patient awake    Reviewed: Allergy & Precautions, H&P , NPO status , Patient's Chart, lab work & pertinent test results  History of Anesthesia Complications Negative for: history of anesthetic complications  Airway Mallampati: II  TM Distance: >3 FB Neck ROM: full    Dental no notable dental hx. (+) Teeth Intact   Pulmonary neg pulmonary ROS, asthma ,  breath sounds clear to auscultation  Pulmonary exam normal       Cardiovascular negative cardio ROS  Rhythm:regular Rate:Normal     Neuro/Psych negative neurological ROS  negative psych ROS   GI/Hepatic negative GI ROS, Neg liver ROS,   Endo/Other  negative endocrine ROSdiabetes  Renal/GU negative Renal ROS  negative genitourinary   Musculoskeletal   Abdominal Normal abdominal exam  (+)   Peds  Hematology negative hematology ROS (+)   Anesthesia Other Findings   Reproductive/Obstetrics (+) Pregnancy                             Anesthesia Physical Anesthesia Plan  ASA: II  Anesthesia Plan: Epidural   Post-op Pain Management:    Induction:   Airway Management Planned:   Additional Equipment:   Intra-op Plan:   Post-operative Plan:   Informed Consent: I have reviewed the patients History and Physical, chart, labs and discussed the procedure including the risks, benefits and alternatives for the proposed anesthesia with the patient or authorized representative who has indicated his/her understanding and acceptance.     Plan Discussed with:   Anesthesia Plan Comments:         Anesthesia Quick Evaluation

## 2015-01-20 NOTE — Lactation Note (Signed)
This note was copied from the chart of Carla Marquez Viera. Lactation Consultation Note  P4, BF 1st child for one month and third child for 2 months and states she wants to bf this child for 6 months. Baby latched in side lying position.  Baby active and alert.  She does come off nipple occasionally. Provided instruction encouraging mother to wait for wide gape and bring her on deep. Some sucks and swallows observed. Mother states she knows how to hand express. Reviewed cluster feeding. Mom encouraged to feed baby 8-12 times/24 hours and with feeding cues.  Mom made aware of O/P services, breastfeeding support groups, community resources, and our phone # for post-discharge questions.      Patient Name: Carla Marquez Tracz ZOXWR'UToday's Date: 01/20/2015 Reason for consult: Initial assessment   Maternal Data Has patient been taught Hand Expression?: Yes Does the patient have breastfeeding experience prior to this delivery?: Yes  Feeding Feeding Type: Breast Fed  LATCH Score/Interventions Latch: Repeated attempts needed to sustain latch, nipple held in mouth throughout feeding, stimulation needed to elicit sucking reflex. Intervention(s): Breast massage;Assist with latch  Audible Swallowing: A few with stimulation Intervention(s): Alternate breast massage  Type of Nipple: Everted at rest and after stimulation  Comfort (Breast/Nipple): Soft / non-tender     Hold (Positioning): Assistance needed to correctly position infant at breast and maintain latch.  LATCH Score: 7  Lactation Tools Discussed/Used     Consult Status Consult Status: Follow-up Date: 01/21/15 Follow-up type: In-patient    Dahlia ByesBerkelhammer, Sharra Cayabyab [facility] 01/20/2015, 2:58 PM

## 2015-01-20 NOTE — Progress Notes (Signed)
S: c/o urge to push  O: Foley balloon out Pitocin 2 miu... Off now  Abd gravid soft nontender VE 6/100/+1 with ctx. ROP Arom brown thick mucus ISE, IUPC placed  Tracing: baseline 140  (+) variable decel  To 70's Ctx q 2-4 mins  IMP: Active phase Previous C/S x2  With successful VBAC Variable decel  2nd to cord compression. No evidence of scar rupture P) amnioinfusion. Epidural. Exaggerated sims position

## 2015-01-21 ENCOUNTER — Ambulatory Visit: Payer: Self-pay

## 2015-01-21 MED ORDER — IBUPROFEN 600 MG PO TABS: 600.0000 mg | ORAL_TABLET | Freq: Four times a day (QID) | ORAL | Status: DC

## 2015-01-21 MED ORDER — OXYCODONE-ACETAMINOPHEN 5-325 MG PO TABS: 1.0000 | ORAL_TABLET | ORAL | Status: DC | PRN

## 2015-01-21 MED ORDER — FERROUS SULFATE 325 (65 FE) MG PO TABS: 325.0000 mg | ORAL_TABLET | Freq: Every day | ORAL | Status: DC

## 2015-01-21 NOTE — Plan of Care (Signed)
Problem: Discharge Progression Outcomes Goal: Remove staples per MD order Outcome: Not Applicable Date Met:  11/73/56 No staples present Goal: MMR given as ordered Outcome: Not Applicable Date Met:  70/14/10 Rubella immune; MMR not indicated

## 2015-01-21 NOTE — Progress Notes (Signed)
PPD #1- SVD  Subjective:   Reports feeling well, desires early discharge Tolerating po/ No nausea or vomiting Bleeding is light Some back pain Pain controlled with Motrin and Percocet Up ad lib / ambulatory / voiding without problems Newborn: breastfeeding    Objective:   VS: VS:  Filed Vitals:   01/20/15 0545 01/20/15 0915 01/20/15 1719 01/21/15 0649  BP: 106/55 104/48 106/66 99/57  Pulse: 106 76 84 87  Temp: 98.5 F (36.9 C) 97.7 F (36.5 C) 98.3 F (36.8 C) 97.9 F (36.6 C)  TempSrc: Oral Oral Oral Oral  Resp: 18 18 18 18   Height:      Weight:      SpO2:    99%    LABS:  Recent Labs  01/19/15 2115 01/20/15 0604  WBC 9.7 14.8*  HGB 11.1* 10.0*  PLT 247 235   Blood type: --/--/O POS, O POS (03/26 2115) Rubella: Immune (08/20 0000)                I&O: Intake/Output      03/27 0701 - 03/28 0700 03/28 0701 - 03/29 0700   Blood     Total Output       Net              Physical Exam: Alert and oriented X3 Abdomen: soft, non-tender, non-distended  Fundus: firm, non-tender, U-2 Perineum: Well approximated, no significant erythema, edema, or drainage; healing well. Lochia: small Extremities: no edema, no calf pain or tenderness    Assessment: PPD #1  [id number]/ S/P:VBAC, peri-urethral laceration Mild anemia Doing well - stable for discharge home   Plan: Discharge home RX's:  Ibuprofen 600mg  po Q 6 hrs prn pain #30 Refill x 0 Niferex 150mg  po QD #30 Refill x 1 Percocet 5/325 1 to 2 po Q 4 hrs prn pain #20 Refill x 0 Routine pp visit in [employer] [facility] booklet given    Donette LarryBHAMBRI, Lavren Lewan, N MSN, CNM 01/21/2015, 9:47 AM

## 2015-01-21 NOTE — Lactation Note (Signed)
This note was copied from the chart of Carla Malachy Moodoymisha Gibas. Lactation Consultation Note  Patient Name: Carla Marquez ZOXWR'UToday's Date: 01/21/2015 Reason for consult: Follow-up assessment;Other (Comment) (per mom early D/C )  Mom is an experienced breast feeding mom , denies soreness, breast getting fuller, Baby latched with depth, multiply swallows noted. Nipple well rounded when baby released  From the breast . Baby fed 17 mins.  LC discussed prevention and tx of sore nipples and engorgement referring to the baby and me  Booklet pages 24 -25. LC instructed mom on the use hand pump , set up and cleaning.  Mother informed of post-discharge support and given phone number to the lactation department, including  services for phone call assistance; out-patient appointments; and breastfeeding support group. List of other  breastfeeding resources in the community given in the handout. Encouraged mother to call for problems or  concerns related to breastfeeding.    Maternal Data    Feeding Feeding Type:  (baby already latched ) Length of feed: 17 min (LC observed the latch,depth noted,multiply swallows )  LATCH Score/Interventions Latch:  (latched with depth )  Audible Swallowing:  (multiply swallows, pe rmom breast are filling )  Type of Nipple:  (nipple appeared normal shape )  Comfort (Breast/Nipple):  (per mom breast are getting fuller )     Hold (Positioning):  (mom independent with latch ) Intervention(s): Breastfeeding basics reviewed     Lactation Tools Discussed/Used     Consult Status Consult Status: Complete Date: 01/21/15    Kathrin Greathouseorio, Vantasia Pinkney Ann 01/21/2015, 3:21 PM

## 2015-01-21 NOTE — Discharge Summary (Signed)
Obstetric Discharge Summary Reason for Admission: onset of labor Prenatal Course: previous CS, A1GDM Intrapartum Procedures: cervical balloon, amnioinfusion, epidural, VBAC Postpartum Procedures: none Complications-Operative and Postpartum: periurethral laceration HEMOGLOBIN  Date Value Ref Range Status  01/20/2015 10.0* 12.0 - 15.0 g/dL Final   HCT  Date Value Ref Range Status  01/20/2015 31.3* 36.0 - 46.0 % Final    Physical Exam:  General: alert and cooperative Lochia: appropriate Uterine Fundus: firm Incision: healing well, no significant drainage, no dehiscence, no significant erythema DVT Evaluation: No evidence of DVT seen on physical exam. Negative Homan's sign. No cords or calf tenderness. No significant calf/ankle edema.  Discharge Diagnoses: Term Pregnancy-delivered; VBAC; Mild anemia; A1GDM, delivered Discharge Information: Date: 01/21/2015 Activity: pelvic rest Diet: routine Medications: PNV, Ibuprofen, Iron and Percocet Condition: stable Instructions: refer to practice specific booklet Discharge to: home Follow-up Information    Follow up with Merlin Golden A, MD. Schedule an appointment as soon as possible for a visit in 6 weeks.   Specialty:  Obstetrics and Gynecology   Contact information:   [address] [place] [id number] [phone]       Newborn Data: Live born female on 01/20/15 Birth Weight: 6 lb 9.3 oz (2985 g) APGAR: 8, 9  Home with mother.  BHAMBRI, MELANIE, N 01/21/2015, 10:19 AM

## 2015-01-28 ENCOUNTER — Inpatient Hospital Stay (HOSPITAL_COMMUNITY): Admission: RE | Admit: 2015-01-28 | Payer: 59 | Source: Ambulatory Visit

## 2016-10-22 ENCOUNTER — Encounter (HOSPITAL_COMMUNITY): Payer: Self-pay | Admitting: Emergency Medicine

## 2016-10-22 ENCOUNTER — Ambulatory Visit (HOSPITAL_COMMUNITY)
Admission: EM | Admit: 2016-10-22 | Discharge: 2016-10-22 | Disposition: A | Discharge status: Home / Self Care | Payer: 59 | Attending: Family Medicine | Admitting: Family Medicine

## 2016-10-22 DIAGNOSIS — L299 Pruritus, unspecified: Secondary | ICD-10-CM | POA: Diagnosis not present

## 2016-10-22 DIAGNOSIS — L2389 Allergic contact dermatitis due to other agents: Secondary | ICD-10-CM

## 2016-10-22 MED ORDER — DIPHENHYDRAMINE HCL 25 MG PO CAPS
ORAL_CAPSULE | ORAL | Status: AC
  Filled 2016-10-22: qty 1

## 2016-10-22 MED ORDER — HYDROXYZINE HCL 25 MG PO TABS: ORAL_TABLET | ORAL | 0 refills | Status: DC

## 2016-10-22 MED ORDER — METHYLPREDNISOLONE 4 MG PO TBPK: ORAL_TABLET | ORAL | 0 refills | Status: DC

## 2016-10-22 MED ORDER — METHYLPREDNISOLONE SODIUM SUCC 125 MG IJ SOLR
INTRAMUSCULAR | Status: AC
  Filled 2016-10-22: qty 2

## 2016-10-22 MED ORDER — METHYLPREDNISOLONE SODIUM SUCC 125 MG IJ SOLR
125.0000 mg | Freq: Once | INTRAMUSCULAR | Status: AC
  Administered 2016-10-22: 125 mg via INTRAVENOUS

## 2016-10-22 MED ORDER — DIPHENHYDRAMINE HCL 25 MG PO CAPS
25.0000 mg | ORAL_CAPSULE | Freq: Once | ORAL | Status: AC
  Administered 2016-10-22: 25 mg via ORAL

## 2016-10-22 NOTE — ED Triage Notes (Signed)
Here for rash onset x3 days all over body  Denies new foods/hgyiene products/meds  A&O x4... NAD

## 2016-10-22 NOTE — ED Provider Notes (Signed)
CSN: [id number]     Arrival date & time 10/22/16  1521 History   None    Chief Complaint  Patient presents with  . Rash   (Consider location/radiation/quality/duration/timing/severity/associated sxs/prior Treatment) Patient c/o rash on her entire body.  She is unaware of what she is allergic to.   The history is provided by the patient.  Rash  Location:  Full body Quality: itchiness and redness   Severity:  Severe Onset quality:  Sudden Duration:  3 days Timing:  Constant Chronicity:  New Relieved by:  Nothing Worsened by:  Nothing   Past Medical History:  Diagnosis Date  . Asthma   . Gestational diabetes   . Vaginal birth after cesarean (VBAC) (3/27) 01/20/2015   Past Surgical History:  Procedure Laterality Date  . CESAREAN SECTION     History reviewed. No pertinent family history. Social History  Substance Use Topics  . Smoking status: Never Smoker  . Smokeless tobacco: Never Used  . Alcohol use No   OB History    Gravida Para Term Preterm AB Living   5 4 4   1 1    SAB TAB Ectopic Multiple Live Births         0 1     Review of Systems  Constitutional: Negative.   HENT: Negative.   Eyes: Negative.   Respiratory: Negative.   Cardiovascular: Negative.   Gastrointestinal: Negative.   Endocrine: Negative.   Genitourinary: Negative.   Musculoskeletal: Negative.   Skin: Positive for rash.  Allergic/Immunologic: Negative.   Neurological: Negative.   Hematological: Negative.   Psychiatric/Behavioral: Negative.     Allergies  Patient has no known allergies.  Home Medications   Prior to Admission medications   Medication Sig Start Date End Date Taking? Authorizing Provider  ferrous sulfate 325 (65 FE) MG tablet Take 1 tablet (325 mg total) by mouth daily. 01/21/15   Donette LarryMelanie Bhambri, CNM  hydrOXYzine (ATARAX/VISTARIL) 25 MG tablet Take 1-2 po q 6 hours prn 10/22/16   Deatra CanterWilliam J Angeni Chaudhuri, FNP  ibuprofen (ADVIL,MOTRIN) 600 MG tablet Take 1 tablet (600 mg  total) by mouth every 6 (six) hours. 01/21/15   Donette LarryMelanie Bhambri, CNM  methylPREDNISolone (MEDROL DOSEPAK) 4 MG TBPK tablet Take 6-5-4-3-2-1 po qd 10/22/16   Deatra CanterWilliam J Shirleymae Hauth, FNP  oxyCODONE-acetaminophen (PERCOCET/ROXICET) 5-325 MG per tablet Take 1-2 tablets by mouth every 4 (four) hours as needed (for pain scale greater than 7). 01/21/15   Donette LarryMelanie Bhambri, CNM  Prenatal Vit-Fe Fumarate-FA (PRENATAL MULTIVITAMIN) TABS tablet Take 1 tablet by mouth daily.    Historical Provider, MD   Meds Ordered and Administered this Visit   Medications  methylPREDNISolone sodium succinate (SOLU-MEDROL) 125 mg/2 mL injection 125 mg (125 mg Intravenous Given 10/22/16 1703)  diphenhydrAMINE (BENADRYL) capsule 25 mg (25 mg Oral Given 10/22/16 1703)    BP 106/62 (BP Location: Right Arm)   Pulse 81   Temp 98.4 F (36.9 C) (Oral)   Resp 20   LMP 10/08/2016   SpO2 100%   Breastfeeding? No  No data found.   Physical Exam  Constitutional: She is oriented to person, place, and time. She appears well-developed and well-nourished.  HENT:  Head: Normocephalic and atraumatic.  Eyes: EOM are normal. Pupils are equal, round, and reactive to light.  Neck: Normal range of motion. Neck supple.  Cardiovascular: Normal rate, regular rhythm and normal heart sounds.   Pulmonary/Chest: Effort normal and breath sounds normal.  Abdominal: Soft. Bowel sounds are normal.  Neurological: She is  alert and oriented to person, place, and time.  Skin: Rash noted.  Erythematous raised itchy rash on forearms, chest, and back  Nursing note and vitals reviewed.   Urgent Care Course   Clinical Course     Procedures (including critical care time)  Labs Review Labs Reviewed - No data to display  Imaging Review No results found.   Visual Acuity Review  Right Eye Distance:   Left Eye Distance:   Bilateral Distance:    Right Eye Near:   Left Eye Near:    Bilateral Near:         MDM   1. Allergic contact  dermatitis due to other agents   2. Itching    Solumedrol 125mg  IM Benadryl po Medrol dose pack as directed 4mg  #21 Hydroxyzine 25mg  one to two po q 6 hours prn #24      Deatra CanterWilliam J Gershon Shorten, FNP 10/22/16 1739

## 2016-11-26 ENCOUNTER — Telehealth: Payer: 59 | Admitting: Nurse Practitioner

## 2016-11-26 DIAGNOSIS — R6889 Other general symptoms and signs: Secondary | ICD-10-CM | POA: Diagnosis not present

## 2016-11-26 MED ORDER — TAMIFLU 75 MG PO CAPS: 75.0000 mg | ORAL_CAPSULE | Freq: Two times a day (BID) | ORAL | 0 refills | Status: DC

## 2016-11-26 NOTE — Progress Notes (Signed)

## 2016-11-26 NOTE — Addendum Note (Signed)
Addended by: Alfred LevinsWYRICK, CINDY D on: 11/26/2016 01:34 PM   Modules accepted: Orders

## 2016-11-26 NOTE — Addendum Note (Signed)
Addended by: Alfred LevinsWYRICK, CINDY D on: 11/26/2016 01:36 PM   Modules accepted: Orders

## 2016-11-26 NOTE — Addendum Note (Signed)
Addended by: Jackquline BerlinLEATH, Chrislynn Mosely J on: 11/26/2016 01:16 PM   Modules accepted: Orders

## 2017-04-02 ENCOUNTER — Encounter: Payer: Self-pay | Admitting: Family

## 2017-04-02 ENCOUNTER — Ambulatory Visit (INDEPENDENT_AMBULATORY_CARE_PROVIDER_SITE_OTHER): Payer: 59 | Admitting: Family

## 2017-04-02 VITALS — BP 116/82 | HR 84 | Ht 62.0 in | Wt 163.0 lb

## 2017-04-02 DIAGNOSIS — F418 Other specified anxiety disorders: Secondary | ICD-10-CM | POA: Diagnosis not present

## 2017-04-02 NOTE — Progress Notes (Signed)
Subjective:    Patient ID: Carla Marquez, female    DOB: 1981/06/10, 36 y.o.   MRN: [id number]  Chief Complaint  Patient presents with  . Establish Care    establish care and possible anxiety    HPI:  Carla Marquez is a 36 y.o. female who  has a past medical history of Asthma; Gestational diabetes; and Vaginal birth after cesarean (VBAC) (3/27) (01/20/2015). and presents today for an office visit to establish care.  This is a new problem. Associated symptoms of increased levels of anxiety have been going on for several years. Describes getting anxious and over-reacting to certain situations. Current stress relief includes counting, crying or praying which sometimes help and sometimes doesn't. Episodes are fairly infrequent last about a day at a time. Severity of the anxiety is mild/moderate.     No Known Allergies    Outpatient Medications Prior to Visit  Medication Sig Dispense Refill  . ferrous sulfate 325 (65 FE) MG tablet Take 1 tablet (325 mg total) by mouth daily. 30 tablet 1  . hydrOXYzine (ATARAX/VISTARIL) 25 MG tablet Take 1-2 po q 6 hours prn 24 tablet 0  . ibuprofen (ADVIL,MOTRIN) 600 MG tablet Take 1 tablet (600 mg total) by mouth every 6 (six) hours. 30 tablet 0  . methylPREDNISolone (MEDROL DOSEPAK) 4 MG TBPK tablet Take 6-5-4-3-2-1 po qd 21 tablet 0  . oxyCODONE-acetaminophen (PERCOCET/ROXICET) 5-325 MG per tablet Take 1-2 tablets by mouth every 4 (four) hours as needed (for pain scale greater than 7). 20 tablet 0  . Prenatal Vit-Fe Fumarate-FA (PRENATAL MULTIVITAMIN) TABS tablet Take 1 tablet by mouth daily.    Marland Kitchen. TAMIFLU 75 MG capsule Take 1 capsule (75 mg total) by mouth 2 (two) times daily. Medically Necessary 10 capsule 0   No facility-administered medications prior to visit.      Past Medical History:  Diagnosis Date  . Asthma    Childhood  . Gestational diabetes   . Vaginal birth after cesarean (VBAC) (3/27) 01/20/2015      Past Surgical  History:  Procedure Laterality Date  . CESAREAN SECTION     x2      Family History  Problem Relation Age of Onset  . Heart disease Mother   . Heart attack Mother [id number]  . Diabetes Maternal Grandmother   . Heart disease Maternal Grandmother   . Diabetes Paternal Grandmother       Social History   Social History  . Marital status: Single    Spouse name: N/A  . Number of children: 4  . Years of education: 14   Occupational History  . Nurse Tech    Social History Main Topics  . Smoking status: Never Smoker  . Smokeless tobacco: Never Used  . Alcohol use No  . Drug use: No  . Sexual activity: Yes   Other Topics Concern  . Not on file   Social History Narrative   Fun/Hobby: Read, Go to movies   Denies abuse and feels safe at home.     Review of Systems  Constitutional: Negative for chills and fever.  Eyes:       Negative for changes in vision  Respiratory: Negative for cough, chest tightness and wheezing.   Cardiovascular: Negative for chest pain, palpitations and leg swelling.  Neurological: Negative for dizziness, weakness and light-headedness.       Objective:    BP 116/82   Pulse 84   Ht 5\' 2"  (1.575 m)   Wt  163 lb (73.9 kg)   LMP 03/31/2017   SpO2 99%   BMI 29.81 kg/m  Nursing note and vital signs reviewed.  Physical Exam  Constitutional: She is oriented to person, place, and time. She appears well-developed and well-nourished. No distress.  Cardiovascular: Normal rate, regular rhythm, normal heart sounds and intact distal pulses.   Pulmonary/Chest: Effort normal and breath sounds normal.  Neurological: She is alert and oriented to person, place, and time.  Skin: Skin is warm and dry.  Psychiatric: She has a normal mood and affect. Her behavior is normal. Judgment and thought content normal.        Assessment & Plan:   Problem List Items Addressed This Visit      Other   Situational anxiety - Primary    Situational anxiety appears  adequately controlled with lifestyle management and an current stress management techniques and coping skills. No medication is needed at this time. Follow-up if symptoms worsen or do not improve.          I have discontinued Ms. Nored's prenatal multivitamin, ferrous sulfate, ibuprofen, oxyCODONE-acetaminophen, methylPREDNISolone, hydrOXYzine, and TAMIFLU.   Follow-up: Return if symptoms worsen or fail to improve.  Jeanine Luz, FNP

## 2017-04-02 NOTE — Assessment & Plan Note (Signed)
Situational anxiety appears adequately controlled with lifestyle management and an current stress management techniques and coping skills. No medication is needed at this time. Follow-up if symptoms worsen or do not improve.

## 2017-04-02 NOTE — Patient Instructions (Addendum)
Thank you for choosing [facility].  SUMMARY AND INSTRUCTIONS:  Please schedule a time for your physical at your convenience  Follow up:  If your symptoms worsen or fail to improve, please contact our office for further instruction, or in case of emergency go directly to the emergency room at the closest medical facility.    Stress and Stress Management Stress is a normal reaction to life events. It is what you feel when life demands more than you are used to or more than you can handle. Some stress can be useful. For example, the stress reaction can help you catch the last bus of the day, study for a test, or meet a deadline at work. But stress that occurs too often or for too long can cause problems. It can affect your emotional health and interfere with relationships and normal daily activities. Too much stress can weaken your immune system and increase your risk for physical illness. If you already have a medical problem, stress can make it worse. What are the causes? All sorts of life events may cause stress. An event that causes stress for one person may not be stressful for another person. Major life events commonly cause stress. These may be positive or negative. Examples include losing your job, moving into a new home, getting married, having a baby, or losing a loved one. Less obvious life events may also cause stress, especially if they occur day after day or in combination. Examples include working long hours, driving in traffic, caring for children, being in debt, or being in a difficult relationship. What are the signs or symptoms? Stress may cause emotional symptoms including, the following:  Anxiety. This is feeling worried, afraid, on edge, overwhelmed, or out of control.  Anger. This is feeling irritated or impatient.  Depression. This is feeling sad, down, helpless, or guilty.  Difficulty focusing, remembering, or making decisions.  Stress may cause physical  symptoms, including the following:  Aches and pains. These may affect your head, neck, back, stomach, or other areas of your body.  Tight muscles or clenched jaw.  Low energy or trouble sleeping.  Stress may cause unhealthy behaviors, including the following:  Eating to feel better (overeating) or skipping meals.  Sleeping too little, too much, or both.  Working too much or putting off tasks (procrastination).  Smoking, drinking alcohol, or using drugs to feel better.  How is this diagnosed? Stress is diagnosed through an assessment by your health care provider. Your health care provider will ask questions about your symptoms and any stressful life events.Your health care provider will also ask about your medical history and may order blood tests or other tests. Certain medical conditions and medicine can cause physical symptoms similar to stress. Mental illness can cause emotional symptoms and unhealthy behaviors similar to stress. Your health care provider may refer you to a mental health professional for further evaluation. How is this treated? Stress management is the recommended treatment for stress.The goals of stress management are reducing stressful life events and coping with stress in healthy ways. Techniques for reducing stressful life events include the following:  Stress identification. Self-monitor for stress and identify what causes stress for you. These skills may help you to avoid some stressful events.  Time management. Set your priorities, keep a calendar of events, and learn to say "no." These tools can help you avoid making too many commitments.  Techniques for coping with stress include the following:  Rethinking the problem. Try to think  realistically about stressful events rather than ignoring them or overreacting. Try to find the positives in a stressful situation rather than focusing on the negatives.  Exercise. Physical exercise can release both physical  and emotional tension. The key is to find a form of exercise you enjoy and do it regularly.  Relaxation techniques. These relax the body and mind. Examples include yoga, meditation, tai chi, biofeedback, deep breathing, progressive muscle relaxation, listening to music, being out in nature, journaling, and other hobbies. Again, the key is to find one or more that you enjoy and can do regularly.  Healthy lifestyle. Eat a balanced diet, get plenty of sleep, and do not smoke. Avoid using alcohol or drugs to relax.  Strong support network. Spend time with family, friends, or other people you enjoy being around.Express your feelings and talk things over with someone you trust.  Counseling or talktherapy with a mental health professional may be helpful if you are having difficulty managing stress on your own. Medicine is typically not recommended for the treatment of stress.Talk to your health care provider if you think you need medicine for symptoms of stress. Follow these instructions at home:  Keep all follow-up visits as directed by your health care provider.  Take all medicines as directed by your health care provider. Contact a health care provider if:  Your symptoms get worse or you start having new symptoms.  You feel overwhelmed by your problems and can no longer manage them on your own. Get help right away if:  You feel like hurting yourself or someone else. This information is not intended to replace advice given to you by your health care provider. Make sure you discuss any questions you have with your health care provider. Document Released: 04/07/2001 Document Revised: 03/19/2016 Document Reviewed: 06/06/2013 Elsevier Interactive Patient Education  2017 [employer].

## 2017-04-20 ENCOUNTER — Telehealth: Payer: Self-pay | Admitting: Licensed Clinical Social Worker

## 2017-04-20 NOTE — Telephone Encounter (Signed)
Reia lm with [facility] to set up a counseling appointment for her daughter. LCSWA called and lm with Moni to reestablish care.

## 2017-08-13 DIAGNOSIS — H52223 Regular astigmatism, bilateral: Secondary | ICD-10-CM | POA: Diagnosis not present

## 2017-08-13 DIAGNOSIS — H5213 Myopia, bilateral: Secondary | ICD-10-CM | POA: Diagnosis not present

## 2017-10-26 NOTE — L&D Delivery Note (Addendum)
Delivery Note Pt pushed very well for 10minutes after lip reduced for delivery.  At 9:37 PM a viable and healthy female was delivered via VBAC, Spontaneous (Presentation: OA ;LOT  ).  APGAR: 8, 9; weight P .   Placenta status: delivered, intact .  Cord: 3V with the following complications: true knot, double nuchal.  Anesthesia: epidural   Episiotomy: None Lacerations: Periurethral abrasion Suture Repair: N/A Est. Blood Loss (mL):  200cc  Mom to postpartum.  Baby to Couplet care / Skin to Skin.  Carla Marquez 06/04/2018, 9:56 PM  Br/O+/RI/Tdap in PNC/Contra ?  Declines circumcision for female infant.  D/w pt r/b/a   Pt desires to have BTL, d/w pt r/b/a - will proceed in AM.

## 2017-11-02 DIAGNOSIS — N911 Secondary amenorrhea: Secondary | ICD-10-CM | POA: Diagnosis not present

## 2017-11-02 DIAGNOSIS — Z3201 Encounter for pregnancy test, result positive: Secondary | ICD-10-CM | POA: Diagnosis not present

## 2017-11-23 DIAGNOSIS — Z113 Encounter for screening for infections with a predominantly sexual mode of transmission: Secondary | ICD-10-CM | POA: Diagnosis not present

## 2017-11-23 DIAGNOSIS — Z124 Encounter for screening for malignant neoplasm of cervix: Secondary | ICD-10-CM | POA: Diagnosis not present

## 2017-11-23 DIAGNOSIS — Z3689 Encounter for other specified antenatal screening: Secondary | ICD-10-CM | POA: Diagnosis not present

## 2017-11-23 DIAGNOSIS — Z3682 Encounter for antenatal screening for nuchal translucency: Secondary | ICD-10-CM | POA: Diagnosis not present

## 2017-11-23 DIAGNOSIS — Z1151 Encounter for screening for human papillomavirus (HPV): Secondary | ICD-10-CM | POA: Diagnosis not present

## 2017-11-23 DIAGNOSIS — Z3A11 11 weeks gestation of pregnancy: Secondary | ICD-10-CM | POA: Diagnosis not present

## 2017-11-23 DIAGNOSIS — O09521 Supervision of elderly multigravida, first trimester: Secondary | ICD-10-CM | POA: Diagnosis not present

## 2017-11-23 DIAGNOSIS — O09519 Supervision of elderly primigravida, unspecified trimester: Secondary | ICD-10-CM | POA: Diagnosis not present

## 2017-11-23 LAB — OB RESULTS CONSOLE HEPATITIS B SURFACE ANTIGEN: Hepatitis B Surface Ag: NEGATIVE

## 2017-11-23 LAB — OB RESULTS CONSOLE ANTIBODY SCREEN: ANTIBODY SCREEN: NEGATIVE

## 2017-11-23 LAB — OB RESULTS CONSOLE RUBELLA ANTIBODY, IGM: RUBELLA: IMMUNE

## 2017-11-23 LAB — OB RESULTS CONSOLE GC/CHLAMYDIA
Chlamydia: NEGATIVE
Gonorrhea: NEGATIVE

## 2017-11-23 LAB — OB RESULTS CONSOLE HIV ANTIBODY (ROUTINE TESTING): HIV: NONREACTIVE

## 2017-11-23 LAB — OB RESULTS CONSOLE ABO/RH: RH Type: POSITIVE

## 2017-11-23 LAB — OB RESULTS CONSOLE RPR: RPR: NONREACTIVE

## 2018-01-12 DIAGNOSIS — O09299 Supervision of pregnancy with other poor reproductive or obstetric history, unspecified trimester: Secondary | ICD-10-CM | POA: Diagnosis not present

## 2018-01-12 DIAGNOSIS — O34211 Maternal care for low transverse scar from previous cesarean delivery: Secondary | ICD-10-CM | POA: Diagnosis not present

## 2018-01-12 DIAGNOSIS — O09519 Supervision of elderly primigravida, unspecified trimester: Secondary | ICD-10-CM | POA: Diagnosis not present

## 2018-01-12 DIAGNOSIS — Z3A18 18 weeks gestation of pregnancy: Secondary | ICD-10-CM | POA: Diagnosis not present

## 2018-01-14 DIAGNOSIS — Z3689 Encounter for other specified antenatal screening: Secondary | ICD-10-CM | POA: Diagnosis not present

## 2018-02-02 ENCOUNTER — Encounter: Payer: Self-pay | Admitting: Registered"

## 2018-02-02 ENCOUNTER — Encounter: Payer: 59 | Attending: Obstetrics and Gynecology | Admitting: Registered"

## 2018-02-02 DIAGNOSIS — O99810 Abnormal glucose complicating pregnancy: Secondary | ICD-10-CM

## 2018-02-02 NOTE — Progress Notes (Signed)

## 2018-02-04 MED FILL — FREESTYLE LITE TEST STRIP: 25 days supply | Qty: 100 | Fill #0

## 2018-02-04 MED FILL — FREESTYLE LANCETS: 25 days supply | Qty: 100 | Fill #0

## 2018-02-04 MED FILL — FREESTYLE LITE METER: 30 days supply | Qty: 1 | Fill #0

## 2018-03-03 ENCOUNTER — Other Ambulatory Visit: Payer: Self-pay | Admitting: *Deleted

## 2018-03-03 NOTE — Patient Outreach (Addendum)
[facility]) Care Management  03/03/2018  Carla Marquez 14-Oct-1981 [id number]   [facility] emailed [facility] Care Management care management  assistant Iverson Alamin advising she was recently diagnosed with gestational diabetes and wishes to enroll in the [facility] Care Management gestational diabetes program. She was securely emailed the program forms and asked to complete and return them to the [facility] Care Management office.  Left message on [name]'s cell phone requesting return call so that furthers details of the program can be discussed, she can be enrolled and her program benefits activated. Await return call. Addendum: [name] never returned call and so was never enrolled in the [facility] Care Management Gestational Diabetes Program.   Bary Richard RN,CCM,CDE [facility] Care Management Coordinator Office Phone [phone] Office Fax [phone]

## 2018-03-17 DIAGNOSIS — Z3689 Encounter for other specified antenatal screening: Secondary | ICD-10-CM | POA: Diagnosis not present

## 2018-03-17 DIAGNOSIS — Z23 Encounter for immunization: Secondary | ICD-10-CM | POA: Diagnosis not present

## 2018-03-17 DIAGNOSIS — O24410 Gestational diabetes mellitus in pregnancy, diet controlled: Secondary | ICD-10-CM | POA: Diagnosis not present

## 2018-03-17 DIAGNOSIS — Z3A28 28 weeks gestation of pregnancy: Secondary | ICD-10-CM | POA: Diagnosis not present

## 2018-03-17 MED FILL — FREESTYLE LITE TEST STRIP: 25 days supply | Qty: 100 | Fill #1

## 2018-04-19 DIAGNOSIS — Z3A32 32 weeks gestation of pregnancy: Secondary | ICD-10-CM | POA: Diagnosis not present

## 2018-04-19 DIAGNOSIS — O24410 Gestational diabetes mellitus in pregnancy, diet controlled: Secondary | ICD-10-CM | POA: Diagnosis not present

## 2018-04-22 ENCOUNTER — Inpatient Hospital Stay (HOSPITAL_COMMUNITY)
Admission: AD | Admit: 2018-04-22 | Discharge: 2018-04-23 | Disposition: A | Discharge status: Home / Self Care | Payer: 59 | Source: Ambulatory Visit | Attending: Obstetrics and Gynecology | Admitting: Obstetrics and Gynecology

## 2018-04-22 ENCOUNTER — Encounter (HOSPITAL_COMMUNITY): Payer: Self-pay

## 2018-04-22 DIAGNOSIS — Z3A33 33 weeks gestation of pregnancy: Secondary | ICD-10-CM | POA: Diagnosis not present

## 2018-04-22 DIAGNOSIS — Z7984 Long term (current) use of oral hypoglycemic drugs: Secondary | ICD-10-CM | POA: Diagnosis not present

## 2018-04-22 DIAGNOSIS — O24415 Gestational diabetes mellitus in pregnancy, controlled by oral hypoglycemic drugs: Secondary | ICD-10-CM | POA: Diagnosis not present

## 2018-04-22 LAB — WET PREP, GENITAL
SPERM: NONE SEEN
Trich, Wet Prep: NONE SEEN
Yeast Wet Prep HPF POC: NONE SEEN

## 2018-04-22 LAB — URINALYSIS, ROUTINE W REFLEX MICROSCOPIC
Bacteria, UA: NONE SEEN
Bilirubin Urine: NEGATIVE
Glucose, UA: NEGATIVE mg/dL
Ketones, ur: NEGATIVE mg/dL
Nitrite: NEGATIVE
Protein, ur: NEGATIVE mg/dL
Specific Gravity, Urine: 1.005 (ref 1.005–1.030)
pH: 7 (ref 5.0–8.0)

## 2018-04-22 MED ORDER — CYCLOBENZAPRINE HCL 10 MG PO TABS
10.0000 mg | ORAL_TABLET | Freq: Once | ORAL | Status: AC
  Administered 2018-04-22: 10 mg via ORAL
  Filled 2018-04-22: qty 1

## 2018-04-22 MED ORDER — NIFEDIPINE 10 MG PO CAPS
10.0000 mg | ORAL_CAPSULE | ORAL | Status: DC | PRN
  Administered 2018-04-22: 10 mg via ORAL
  Filled 2018-04-22: qty 1

## 2018-04-22 MED ORDER — NIFEDIPINE 10 MG PO CAPS: 10.0000 mg | ORAL_CAPSULE | ORAL | 0 refills | Status: DC | PRN

## 2018-04-22 MED ORDER — CYCLOBENZAPRINE HCL 10 MG PO TABS: 10.0000 mg | ORAL_TABLET | Freq: Two times a day (BID) | ORAL | 0 refills | Status: DC | PRN

## 2018-04-22 NOTE — MAU Note (Signed)
Feeling cntx since 1800. Has had Carla PeltonBraxton Hicks before but these are in her back. Feels like may be 10 mins. Apart. Denies LOF or bleeding. +FM

## 2018-04-22 NOTE — MAU Provider Note (Signed)
Patient Carla Marquez is a 37 y.o. [phone] At [redacted]w[redacted]d here with complaints of low back pain and contractions that started at 1800. Patient is a Psychologist, sport and exercise at [employer]. She denies discharge, dysuria, LOF, vaginal bleeding. She feels strong fetal movements. Her pregnancy is complicated by Gestational diabetes on metformin and history of c-section.   History     CSN: [id number]  Arrival date and time: 04/22/18 2121   None     Chief Complaint  Patient presents with  . Contractions   Abdominal Pain  This is a new problem. The current episode started today. The onset quality is sudden. Episode frequency: every 10 minutes. The problem has been unchanged. The pain is located in the suprapubic region. The pain is at a severity of 6/10. The quality of the pain is cramping. The abdominal pain does not radiate. Pertinent negatives include no constipation, diarrhea, dysuria, fever, nausea or vomiting. Nothing aggravates the pain. The pain is relieved by being still.   She also has pain in her lower back. It is in the center of her back. 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  OB History    Gravida  7   Para  4   Term  4   Preterm      AB  2   Living  4     SAB      TAB  2   Ectopic      Multiple  0   Live Births  1           Past Medical History:  Diagnosis Date  . Asthma    Childhood  . Gestational diabetes   . Vaginal birth after cesarean (VBAC) (3/27) 01/20/2015    Past Surgical History:  Procedure Laterality Date  . CESAREAN SECTION     x2    Family History  Problem Relation Age of Onset  . Heart disease Mother   . Heart attack Mother 21  . Diabetes Maternal Grandmother   . Heart disease Maternal Grandmother   . Diabetes Paternal Grandmother     Social History   Tobacco Use  . Smoking status: Never Smoker  . Smokeless tobacco: Never Used  Substance Use Topics  . Alcohol use: No  . Drug use: No     Allergies: No Known Allergies  Medications Prior to Admission  Medication Sig Dispense Refill Last Dose  . metFORMIN (GLUCOPHAGE) 500 MG tablet Take 500 mg by mouth at bedtime.   Past Week at Unknown time  . Prenatal Vit-Fe Fumarate-FA (MULTIVITAMIN-PRENATAL) 27-0.8 MG TABS tablet Take 1 tablet by mouth daily at 12 noon.   04/22/2018 at Unknown time    Review of Systems  Constitutional: Negative for fever.  HENT: Negative.   Respiratory: Negative.   Cardiovascular: Negative.   Gastrointestinal: Positive for abdominal pain. Negative for constipation, diarrhea, nausea and vomiting.  Genitourinary: Negative.  Negative for dysuria.  Musculoskeletal: Positive for back pain.  Neurological: Negative.   Hematological: Negative.   Psychiatric/Behavioral: Negative.    Physical Exam   Blood pressure 125/76, pulse (!) 111, temperature 98.3 F (36.8 C), temperature source Oral, resp. rate 18, height 5\' 2"  (1.575 m), weight 169 lb 1.3 oz (76.7 kg), SpO2 100 %.  Physical Exam  Constitutional: She is oriented to person, place, and time. She appears well-developed.  HENT:  Head: Normocephalic.  Eyes: Pupils are equal, round, and reactive to light.  Neck: Normal  range of motion.  GI: Soft.  Genitourinary: Vagina normal.  Genitourinary Comments: Normal external female genitalia; cervix is posterior and FT. No CMT, suprapubic or adnexal tenderness.   Neurological: She is alert and oriented to person, place, and time. She has normal reflexes.  Skin: Skin is warm and dry.  Psychiatric: She has a normal mood and affect.    MAU Course  Procedures  MDM  -NST- 140 bpm, mod var, present acel, neg acel, irregular contractions -Cervix unchanged after 1 hour of monitoring.  Patient received flexeril and procardia in MAU; she feels a little better.  Ffn not done as patient's os was FT.   Discussed patient NST, lab results, complaints and physical exam with Dr. Senaida Oresichardson (private MD), who  agrees that patient is stable for discharge with RX for procardia, Flexeril and rest this weekend.   -wet prep shows clue cells but patient is asymptomatic and thus no treatment warranted.   Assessment and Plan   1. Preterm labor in third trimester without delivery    2. Keep appt on Tuesday for monitoring.   3. Reviewed return precautions and warning signs of pregnancy. Patient verbalized understanding.   4. DC home with RX for procardia PRN and Flexeril  Charlesetta GaribaldiKathryn Lorraine [facility] 04/22/2018, 10:22 PM

## 2018-04-22 NOTE — Discharge Instructions (Signed)
-take one Procardia pill (10 mg) every 4 hours when you feel contractions starting -Stay hydrated; try to rest. Avoid intercourse.  -Return to MAu if any bleeding, leaking of fluid.  -Flexeril for back pain  Preventing Preterm Birth Preterm birth is when your baby is delivered between 20 weeks and 37 weeks of pregnancy. A full-term pregnancy lasts for at least 37 weeks. Preterm birth can be dangerous for your baby because the last few weeks of pregnancy are an important time for your baby's brain and lungs to grow. Many things can cause a baby to be born early. Sometimes the cause is not known. There are certain factors that make you more likely to experience preterm birth, such as:  Having a previous baby born preterm.  Being pregnant with twins or other multiples.  Having had fertility treatment.  Being overweight or underweight at the start of your pregnancy.  Having any of the following during pregnancy: ? An infection, including a urinary tract infection (UTI) or an STI (sexually transmitted infection). ? High blood pressure. ? Diabetes. ? Vaginal bleeding.  Being age 36 or older.  Being age 49 or younger.  Getting pregnant within 6 months of a previous pregnancy.  Suffering extreme stress or physical or emotional abuse during pregnancy.  Standing for long periods of time during pregnancy, such as working at a job that requires standing.  What are the risks? The most serious risk of preterm birth is that the baby may not survive. This is more likely to happen if a baby is born before 34 weeks. Other risks and complications of preterm birth may include your baby having:  Breathing problems.  Brain damage that affects movement and coordination (cerebral palsy).  Feeding difficulties.  Vision or hearing problems.  Infections or inflammation of the digestive tract (colitis).  Developmental delays.  Learning disabilities.  Higher risk for diabetes, heart disease, and  high blood pressure later in life.  What can I do to lower my risk? Medical care  The most important thing you can do to lower your risk for preterm birth is to get routine medical care during pregnancy (prenatal care). If you have a high risk of preterm birth, you may be referred to a health care provider who specializes in managing high-risk pregnancies (perinatologist). You may be given medicine to help prevent preterm birth. Lifestyle changes Certain lifestyle changes can also lower your risk of preterm birth:  Wait at least 6 months after a pregnancy to become pregnant again.  Try to plan pregnancy for when you are between 60 and 34 years old.  Get to a healthy weight before getting pregnant. If you are overweight, work with your health care provider to safely lose weight.  Do not use any products that contain nicotine or tobacco, such as cigarettes and e-cigarettes. If you need help quitting, ask your health care provider.  Do not drink alcohol.  Do not use drugs.  Where to find support: For more support, consider:  Talking with your health care provider.  Talking with a therapist or substance abuse counselor, if you need help quitting.  Working with a diet and nutrition specialist (dietitian) or a Systems analyst to maintain a healthy weight.  Joining a support group.  Where to find more information: Learn more about preventing preterm birth from:  Centers for Disease Control and Prevention: http://curry.org/  March of Dimes: marchofdimes.org/complications/premature-babies.aspx  American Pregnancy Association: americanpregnancy.org/labor-and-birth/premature-labor  Contact a health care provider if:  You have any of  the following signs of preterm labor before 37 weeks: ? A change or increase in vaginal discharge. ? Fluid leaking from your vagina. ? Pressure or cramps in your lower abdomen. ? A backache that does not  go away or gets worse. ? Regular tightening (contractions) in your lower abdomen. Summary  Preterm birth means having your baby during weeks 20-37 of pregnancy.  Preterm birth may put your baby at risk for physical and mental problems.  Getting good prenatal care can help prevent preterm birth.  You can lower your risk of preterm birth by making certain lifestyle changes, such as not smoking and not using alcohol. This information is not intended to replace advice given to you by your health care provider. Make sure you discuss any questions you have with your health care provider. Document Released: 11/26/2015 Document Revised: 06/20/2016 Document Reviewed: 06/20/2016 Elsevier Interactive Patient Education  [employer]2018 Elsevier Inc.

## 2018-04-23 DIAGNOSIS — Z7984 Long term (current) use of oral hypoglycemic drugs: Secondary | ICD-10-CM | POA: Diagnosis not present

## 2018-04-23 DIAGNOSIS — Z3A33 33 weeks gestation of pregnancy: Secondary | ICD-10-CM | POA: Diagnosis not present

## 2018-04-23 DIAGNOSIS — O24415 Gestational diabetes mellitus in pregnancy, controlled by oral hypoglycemic drugs: Secondary | ICD-10-CM | POA: Diagnosis not present

## 2018-04-25 LAB — GC/CHLAMYDIA PROBE AMP (~~LOC~~) NOT AT ARMC
Chlamydia: NEGATIVE
NEISSERIA GONORRHEA: POSITIVE — AB

## 2018-04-26 ENCOUNTER — Encounter: Payer: Self-pay | Admitting: Nurse Practitioner

## 2018-04-26 DIAGNOSIS — A549 Gonococcal infection, unspecified: Secondary | ICD-10-CM | POA: Insufficient documentation

## 2018-04-26 DIAGNOSIS — O24410 Gestational diabetes mellitus in pregnancy, diet controlled: Secondary | ICD-10-CM | POA: Diagnosis not present

## 2018-04-26 DIAGNOSIS — Z3A33 33 weeks gestation of pregnancy: Secondary | ICD-10-CM | POA: Diagnosis not present

## 2018-04-27 DIAGNOSIS — O3429 Maternal care due to uterine scar from other previous surgery: Secondary | ICD-10-CM | POA: Diagnosis not present

## 2018-04-27 DIAGNOSIS — O09523 Supervision of elderly multigravida, third trimester: Secondary | ICD-10-CM | POA: Diagnosis not present

## 2018-04-27 DIAGNOSIS — O98219 Gonorrhea complicating pregnancy, unspecified trimester: Secondary | ICD-10-CM | POA: Diagnosis not present

## 2018-04-27 DIAGNOSIS — Z3A33 33 weeks gestation of pregnancy: Secondary | ICD-10-CM | POA: Diagnosis not present

## 2018-04-29 DIAGNOSIS — Z3A34 34 weeks gestation of pregnancy: Secondary | ICD-10-CM | POA: Diagnosis not present

## 2018-04-29 DIAGNOSIS — O24415 Gestational diabetes mellitus in pregnancy, controlled by oral hypoglycemic drugs: Secondary | ICD-10-CM | POA: Diagnosis not present

## 2018-05-06 DIAGNOSIS — Z3A35 35 weeks gestation of pregnancy: Secondary | ICD-10-CM | POA: Diagnosis not present

## 2018-05-06 DIAGNOSIS — O24410 Gestational diabetes mellitus in pregnancy, diet controlled: Secondary | ICD-10-CM | POA: Diagnosis not present

## 2018-05-10 DIAGNOSIS — Z3A35 35 weeks gestation of pregnancy: Secondary | ICD-10-CM | POA: Diagnosis not present

## 2018-05-10 DIAGNOSIS — O24410 Gestational diabetes mellitus in pregnancy, diet controlled: Secondary | ICD-10-CM | POA: Diagnosis not present

## 2018-05-13 DIAGNOSIS — Z3685 Encounter for antenatal screening for Streptococcus B: Secondary | ICD-10-CM | POA: Diagnosis not present

## 2018-05-13 DIAGNOSIS — O26843 Uterine size-date discrepancy, third trimester: Secondary | ICD-10-CM | POA: Diagnosis not present

## 2018-05-13 DIAGNOSIS — Z3A36 36 weeks gestation of pregnancy: Secondary | ICD-10-CM | POA: Diagnosis not present

## 2018-05-13 LAB — OB RESULTS CONSOLE GBS: GBS: POSITIVE

## 2018-05-16 MED FILL — FREESTYLE LITE TEST STRIP: 25 days supply | Qty: 100 | Fill #2

## 2018-05-24 DIAGNOSIS — O09519 Supervision of elderly primigravida, unspecified trimester: Secondary | ICD-10-CM | POA: Diagnosis not present

## 2018-05-24 DIAGNOSIS — Z3A37 37 weeks gestation of pregnancy: Secondary | ICD-10-CM | POA: Diagnosis not present

## 2018-05-27 DIAGNOSIS — O98213 Gonorrhea complicating pregnancy, third trimester: Secondary | ICD-10-CM | POA: Diagnosis not present

## 2018-05-27 DIAGNOSIS — O09293 Supervision of pregnancy with other poor reproductive or obstetric history, third trimester: Secondary | ICD-10-CM | POA: Diagnosis not present

## 2018-05-27 DIAGNOSIS — Z3A38 38 weeks gestation of pregnancy: Secondary | ICD-10-CM | POA: Diagnosis not present

## 2018-05-27 DIAGNOSIS — Z113 Encounter for screening for infections with a predominantly sexual mode of transmission: Secondary | ICD-10-CM | POA: Diagnosis not present

## 2018-05-30 LAB — OB RESULTS CONSOLE GC/CHLAMYDIA: Gonorrhea: NEGATIVE

## 2018-05-31 DIAGNOSIS — Z3A38 38 weeks gestation of pregnancy: Secondary | ICD-10-CM | POA: Diagnosis not present

## 2018-05-31 DIAGNOSIS — O24410 Gestational diabetes mellitus in pregnancy, diet controlled: Secondary | ICD-10-CM | POA: Diagnosis not present

## 2018-06-01 ENCOUNTER — Encounter (HOSPITAL_COMMUNITY): Payer: Self-pay | Admitting: *Deleted

## 2018-06-01 ENCOUNTER — Telehealth (HOSPITAL_COMMUNITY): Payer: Self-pay | Admitting: *Deleted

## 2018-06-01 NOTE — Telephone Encounter (Signed)
Preadmission screen  

## 2018-06-03 NOTE — H&P (Signed)
Carla Marquez is a 37 y.o. female [phone] at 39+ with GDM for IOL.  Pt with GDM controlled with metformin.  Also AMA, GBBS +, declined genetic screening, desires BTL, GC in pregnancy.  Pregnancy dated by LMP c/w First Trimester [place].    OB History    Gravida  7   Para  4   Term  4   Preterm      AB  2   Living  4     SAB      TAB  2   Ectopic      Multiple  0   Live Births  1         TAB 6#10 LTCS 7#15 rLTCS 7#2 VBAC TAB 7# SVD  H/o +Chl, trich, + GC in preg No abn pap  Past Medical History:  Diagnosis Date  . AMA (advanced maternal age) multigravida 35+   . Anemia   . Asthma    Childhood  . Gestational diabetes    metformin  . Hx of gonorrhea   . Vaginal birth after cesarean (VBAC) (3/27) 01/20/2015   Past Surgical History:  Procedure Laterality Date  . CESAREAN SECTION     x2  TAB x2  Family History: family history includes Diabetes in her maternal grandmother and paternal grandmother; Heart attack (age of onset: 50) in her mother; Heart disease in her maternal grandmother and mother. Social History:  reports that she has never smoked. She has never used smokeless tobacco. She reports that she does not drink alcohol or use drugs.  Meds Metformin, PNV All NKDA     Maternal Diabetes: Yes:  Diabetes Type:  Insulin/Medication controlled Genetic Screening: Declined Maternal Ultrasounds/Referrals: Normal Fetal Ultrasounds or other Referrals:  None Maternal Substance Abuse:  No Significant Maternal Medications:  Meds include: Other:  Significant Maternal Lab Results:  Lab values include: Group B Strep positive Other Comments:  On Metformin for GDM  Review of Systems  Constitutional: Negative.   HENT: Negative.   Eyes: Negative.   Respiratory: Negative.   Cardiovascular: Negative.   Gastrointestinal: Negative.   Genitourinary: Negative.   Musculoskeletal: Negative.   Skin: Negative.   Neurological: Negative.   Psychiatric/Behavioral:  Negative.    Maternal Medical History:  Contractions: Frequency: irregular.    Fetal activity: Perceived fetal activity is normal.    Prenatal Complications - Diabetes: gestational. Diabetes is managed by oral agent (monotherapy).        There were no vitals taken for this visit. Maternal Exam:  Abdomen: Patient reports no abdominal tenderness. Surgical scars: low transverse.   Fundal height is app for gestation.   Estimated fetal weight is 7.5 -8#.   Fetal presentation: vertex  Introitus: Normal vulva. Normal vagina.    Physical Exam  Constitutional: She is oriented to person, place, and time. She appears well-developed and well-nourished.  HENT:  Head: Normocephalic and atraumatic.  Cardiovascular: Normal rate and regular rhythm.  Respiratory: Effort normal and breath sounds normal. No respiratory distress. She has no wheezes.  GI: Soft. Bowel sounds are normal. She exhibits no distension. There is no tenderness.  Musculoskeletal: Normal range of motion.  Neurological: She is alert and oriented to person, place, and time.  Skin: Skin is warm and dry.  Psychiatric: She has a normal mood and affect. Her behavior is normal.    Prenatal labs: ABO, Rh: O/Positive/-- (01/29 0000) Antibody: Negative (01/29 0000) Rubella: Immune (01/29 0000) RPR: Nonreactive (01/29 0000)  HBsAg: Negative (01/29 0000)  HIV: Non-reactive (01/29 0000)  GBS: Positive (07/19 0000)   Tdap 5/23 Hgb 12.5/Plt 306/Ur Cx neg/Chl neg/GC neg/Varicella +/Hgb electro WNL/Pap WNL/glucola 156/anatomy WNL   Assessment/Plan: 37yo [id number] at 39+ with GDM for IOL Foley bulb an dpitocin for IOL Expect successful VBAC Monitor sugars   Kingstin Heims Bovard-Stuckert 06/03/2018, 11:47 PM

## 2018-06-04 ENCOUNTER — Other Ambulatory Visit: Payer: Self-pay

## 2018-06-04 ENCOUNTER — Encounter (HOSPITAL_COMMUNITY): Payer: Self-pay

## 2018-06-04 ENCOUNTER — Inpatient Hospital Stay (HOSPITAL_COMMUNITY): Payer: 59 | Admitting: Anesthesiology

## 2018-06-04 ENCOUNTER — Inpatient Hospital Stay (HOSPITAL_COMMUNITY)
Admission: RE | Admit: 2018-06-04 | Discharge: 2018-06-06 | DRG: 807 | Disposition: A | Discharge status: Home / Self Care | Payer: 59 | Attending: Obstetrics and Gynecology | Admitting: Obstetrics and Gynecology

## 2018-06-04 DIAGNOSIS — O99824 Streptococcus B carrier state complicating childbirth: Secondary | ICD-10-CM | POA: Diagnosis present

## 2018-06-04 DIAGNOSIS — O34211 Maternal care for low transverse scar from previous cesarean delivery: Secondary | ICD-10-CM | POA: Diagnosis present

## 2018-06-04 DIAGNOSIS — O24420 Gestational diabetes mellitus in childbirth, diet controlled: Secondary | ICD-10-CM | POA: Diagnosis present

## 2018-06-04 DIAGNOSIS — O9902 Anemia complicating childbirth: Secondary | ICD-10-CM | POA: Diagnosis present

## 2018-06-04 DIAGNOSIS — O24425 Gestational diabetes mellitus in childbirth, controlled by oral hypoglycemic drugs: Secondary | ICD-10-CM | POA: Diagnosis not present

## 2018-06-04 DIAGNOSIS — Z3A39 39 weeks gestation of pregnancy: Secondary | ICD-10-CM

## 2018-06-04 DIAGNOSIS — D649 Anemia, unspecified: Secondary | ICD-10-CM | POA: Diagnosis present

## 2018-06-04 DIAGNOSIS — Z3490 Encounter for supervision of normal pregnancy, unspecified, unspecified trimester: Secondary | ICD-10-CM

## 2018-06-04 LAB — GLUCOSE, CAPILLARY
GLUCOSE-CAPILLARY: 87 mg/dL (ref 70–99)
GLUCOSE-CAPILLARY: 83 mg/dL (ref 70–99)
Glucose-Capillary: 72 mg/dL (ref 70–99)
Glucose-Capillary: 78 mg/dL (ref 70–99)

## 2018-06-04 LAB — CBC
HCT: 29.3 % — ABNORMAL LOW (ref 36.0–46.0)
HEMOGLOBIN: 9.5 g/dL — AB (ref 12.0–15.0)
MCH: 25.5 pg — AB (ref 26.0–34.0)
MCHC: 32.4 g/dL (ref 30.0–36.0)
MCV: 78.8 fL (ref 78.0–100.0)
PLATELETS: 278 10*3/uL (ref 150–400)
RBC: 3.72 MIL/uL — AB (ref 3.87–5.11)
RDW: 17.7 % — ABNORMAL HIGH (ref 11.5–15.5)
WBC: 6.7 10*3/uL (ref 4.0–10.5)

## 2018-06-04 LAB — TYPE AND SCREEN
ABO/RH(D): O POS
ANTIBODY SCREEN: NEGATIVE

## 2018-06-04 MED ORDER — LACTATED RINGERS IV SOLN: 500.0000 mL | Freq: Once | INTRAVENOUS | Status: DC

## 2018-06-04 MED ORDER — LACTATED RINGERS IV SOLN
INTRAVENOUS | Status: DC
  Administered 2018-06-04: 1000 mL via INTRAVENOUS
  Administered 2018-06-04: 21:00:00 via INTRAVENOUS

## 2018-06-04 MED ORDER — SODIUM CHLORIDE 0.9 % IV SOLN
5.0000 10*6.[IU] | Freq: Once | INTRAVENOUS | Status: AC
  Administered 2018-06-04: 5 10*6.[IU] via INTRAVENOUS
  Filled 2018-06-04: qty 5

## 2018-06-04 MED ORDER — PHENYLEPHRINE 40 MCG/ML (10ML) SYRINGE FOR IV PUSH (FOR BLOOD PRESSURE SUPPORT)
80.0000 ug | PREFILLED_SYRINGE | INTRAVENOUS | Status: DC | PRN
  Filled 2018-06-04: qty 5
  Filled 2018-06-04: qty 10

## 2018-06-04 MED ORDER — ACETAMINOPHEN 325 MG PO TABS: 650.0000 mg | ORAL_TABLET | ORAL | Status: DC | PRN

## 2018-06-04 MED ORDER — OXYTOCIN BOLUS FROM INFUSION
500.0000 mL | Freq: Once | INTRAVENOUS | Status: AC
  Administered 2018-06-04: 500 mL via INTRAVENOUS

## 2018-06-04 MED ORDER — EPHEDRINE 5 MG/ML INJ
10.0000 mg | INTRAVENOUS | Status: DC | PRN
  Filled 2018-06-04: qty 2

## 2018-06-04 MED ORDER — OXYTOCIN 40 UNITS IN LACTATED RINGERS INFUSION - SIMPLE MED
1.0000 m[IU]/min | INTRAVENOUS | Status: DC
  Administered 2018-06-04: 2 m[IU]/min via INTRAVENOUS

## 2018-06-04 MED ORDER — DIPHENHYDRAMINE HCL 50 MG/ML IJ SOLN: 12.5000 mg | INTRAMUSCULAR | Status: DC | PRN

## 2018-06-04 MED ORDER — OXYTOCIN 40 UNITS IN LACTATED RINGERS INFUSION - SIMPLE MED
2.5000 [IU]/h | INTRAVENOUS | Status: DC
  Administered 2018-06-04: 2.5 [IU]/h via INTRAVENOUS
  Filled 2018-06-04: qty 1000

## 2018-06-04 MED ORDER — LIDOCAINE HCL (PF) 1 % IJ SOLN
30.0000 mL | INTRAMUSCULAR | Status: DC | PRN
  Filled 2018-06-04: qty 30

## 2018-06-04 MED ORDER — LACTATED RINGERS IV SOLN
500.0000 mL | INTRAVENOUS | Status: DC | PRN
  Administered 2018-06-04 (×4): 500 mL via INTRAVENOUS

## 2018-06-04 MED ORDER — LACTATED RINGERS IV SOLN
500.0000 mL | Freq: Once | INTRAVENOUS | Status: AC
  Administered 2018-06-04: 500 mL via INTRAVENOUS

## 2018-06-04 MED ORDER — ONDANSETRON HCL 4 MG/2ML IJ SOLN: 4.0000 mg | Freq: Four times a day (QID) | INTRAMUSCULAR | Status: DC | PRN

## 2018-06-04 MED ORDER — PHENYLEPHRINE 40 MCG/ML (10ML) SYRINGE FOR IV PUSH (FOR BLOOD PRESSURE SUPPORT): 80.0000 ug | PREFILLED_SYRINGE | INTRAVENOUS | Status: DC | PRN

## 2018-06-04 MED ORDER — TERBUTALINE SULFATE 1 MG/ML IJ SOLN
0.2500 mg | Freq: Once | INTRAMUSCULAR | Status: DC | PRN
  Filled 2018-06-04: qty 1

## 2018-06-04 MED ORDER — EPHEDRINE 5 MG/ML INJ: 10.0000 mg | INTRAVENOUS | Status: DC | PRN

## 2018-06-04 MED ORDER — OXYCODONE-ACETAMINOPHEN 5-325 MG PO TABS: 2.0000 | ORAL_TABLET | ORAL | Status: DC | PRN

## 2018-06-04 MED ORDER — BUTORPHANOL TARTRATE 1 MG/ML IJ SOLN: 1.0000 mg | INTRAMUSCULAR | Status: DC | PRN

## 2018-06-04 MED ORDER — FENTANYL 2.5 MCG/ML BUPIVACAINE 1/10 % EPIDURAL INFUSION (WH - ANES)
14.0000 mL/h | INTRAMUSCULAR | Status: DC | PRN
  Administered 2018-06-04: 14 mL/h via EPIDURAL
  Filled 2018-06-04: qty 100

## 2018-06-04 MED ORDER — PENICILLIN G 3 MILLION UNITS IVPB - SIMPLE MED
3.0000 10*6.[IU] | INTRAVENOUS | Status: DC
  Administered 2018-06-04 (×3): 3 10*6.[IU] via INTRAVENOUS
  Filled 2018-06-04 (×3): qty 100
  Filled 2018-06-04: qty 3

## 2018-06-04 MED ORDER — OXYCODONE-ACETAMINOPHEN 5-325 MG PO TABS: 1.0000 | ORAL_TABLET | ORAL | Status: DC | PRN

## 2018-06-04 MED ORDER — LIDOCAINE HCL (PF) 1 % IJ SOLN
INTRAMUSCULAR | Status: DC | PRN
  Administered 2018-06-04: 5 mL via EPIDURAL

## 2018-06-04 MED ORDER — PHENYLEPHRINE 40 MCG/ML (10ML) SYRINGE FOR IV PUSH (FOR BLOOD PRESSURE SUPPORT)
80.0000 ug | PREFILLED_SYRINGE | INTRAVENOUS | Status: DC | PRN
  Filled 2018-06-04: qty 5

## 2018-06-04 MED ORDER — SOD CITRATE-CITRIC ACID 500-334 MG/5ML PO SOLN: 30.0000 mL | ORAL | Status: DC | PRN

## 2018-06-04 NOTE — Anesthesia Procedure Notes (Signed)
Epidural Patient location during procedure: OB Start time: 06/04/2018 6:05 PM End time: 06/04/2018 6:11 PM  Staffing Anesthesiologist: Shelton SilvasHollis, Karlynn Furrow D, MD Performed: anesthesiologist   Preanesthetic Checklist Completed: patient identified, site marked, surgical consent, pre-op evaluation, timeout performed, IV checked, risks and benefits discussed and monitors and equipment checked  Epidural Patient position: sitting Prep: ChloraPrep Patient monitoring: heart rate, continuous pulse ox and blood pressure Approach: midline Location: L3-L4 Injection technique: LOR saline  Needle:  Needle type: Tuohy  Needle gauge: 17 G Needle length: 9 cm Catheter type: closed end flexible Catheter size: 20 Guage Test dose: negative and 1.5% lidocaine  Assessment Events: blood not aspirated, injection not painful, no injection resistance and no paresthesia  Additional Notes LOR @ 5  Patient identified. Risks/Benefits/Options discussed with patient including but not limited to bleeding, infection, nerve damage, paralysis, failed block, incomplete pain control, headache, blood pressure changes, nausea, vomiting, reactions to medications, itching and postpartum back pain. Confirmed with bedside nurse the patient's most recent platelet count. Confirmed with patient that they are not currently taking any anticoagulation, have any bleeding history or any family history of bleeding disorders. Patient expressed understanding and wished to proceed. All questions were answered. Sterile technique was used throughout the entire procedure. Please see nursing notes for vital signs. Test dose was given through epidural catheter and negative prior to continuing to dose epidural or start infusion. Warning signs of high block given to the patient including shortness of breath, tingling/numbness in hands, complete motor block, or any concerning symptoms with instructions to call for help. Patient was given instructions on  fall risk and not to get out of bed. All questions and concerns addressed with instructions to call with any issues or inadequate analgesia.    Reason for block:procedure for pain

## 2018-06-04 NOTE — Progress Notes (Signed)
Patient ID: Carla Marquez, female   DOB: 1981-04-21, 37 y.o.   MRN: [id number]   H&P reviewed, no changes  AFVSS gen NAD FHTs 140's, mod var, one late w long ctx, close monitoring.  Category 2 toco irr  SVE 1/70/-2 Foley bulb placed w/o difficulty  D/w pt epidural given TOLAC Will continue IOL Monitor sugars

## 2018-06-04 NOTE — Anesthesia Preprocedure Evaluation (Signed)
Anesthesia Evaluation  Patient identified by MRN, date of birth, ID band Patient awake    Reviewed: Allergy & Precautions, Patient's Chart, lab work & pertinent test results  Airway Mallampati: I       Dental no notable dental hx.    Pulmonary asthma ,    Pulmonary exam normal        Cardiovascular negative cardio ROS Normal cardiovascular exam     Neuro/Psych Anxiety negative neurological ROS     GI/Hepatic negative GI ROS, Neg liver ROS,   Endo/Other  diabetes, Gestational, Oral Hypoglycemic Agents  Renal/GU      Musculoskeletal negative musculoskeletal ROS (+)   Abdominal   Peds  Hematology negative hematology ROS (+)   Anesthesia Other Findings   Reproductive/Obstetrics (+) Pregnancy                             Anesthesia Physical Anesthesia Plan  ASA: II  Anesthesia Plan: Epidural   Post-op Pain Management:    Induction:   PONV Risk Score and Plan:   Airway Management Planned:   Additional Equipment: None  Intra-op Plan:   Post-operative Plan:   Informed Consent: I have reviewed the patients History and Physical, chart, labs and discussed the procedure including the risks, benefits and alternatives for the proposed anesthesia with the patient or authorized representative who has indicated his/her understanding and acceptance.     Plan Discussed with:   Anesthesia Plan Comments: (Lab Results      Component                Value               Date                      WBC                      6.7                 06/04/2018                HGB                      9.5 (L)             06/04/2018                HCT                      29.3 (L)            06/04/2018                MCV                      78.8                06/04/2018                PLT                      278                 06/04/2018           )        Anesthesia Quick Evaluation

## 2018-06-04 NOTE — Anesthesia Pain Management Evaluation Note (Signed)
  CRNA Pain Management Visit Note  Patient: Carla Marquez, 37 y.o., female  "Hello I am a member of the anesthesia team at [facility]. We have an anesthesia team available at all times to provide care throughout the hospital, including epidural management and anesthesia for C-section. I don't know your plan for the delivery whether it a natural birth, water birth, IV sedation, nitrous supplementation, doula or epidural, but we want to meet your pain goals."   1.Was your pain managed to your expectations on prior hospitalizations?   Yes   2.What is your expectation for pain management during this hospitalization?     Labor support without medications  3.How can we help you reach that goal? unsure  Record the patient's initial score and the patient's pain goal.   Pain: 7  Pain Goal: 7 [facility] wants you to be able to say your pain was always managed very well.  Cephus ShellingBURGER,Jomarion Mish 06/04/2018

## 2018-06-04 NOTE — Progress Notes (Signed)
Patient ID: Carla Marquez, female   DOB: 10/22/1981, 37 y.o.   MRN: [id number]  Some variables after epidural  AFVSS gen NAD FHTs 130's mod var, + accels, some variables category 1-2 toco Q 2-414min  SVE 6/90/0  AROM for clear fluid, w/o diff/comp  Cont close monitoring Expect SVD soon

## 2018-06-04 NOTE — Progress Notes (Signed)
POC discussed with pt. Pt verbalizes understanding and will notify RN of discomfort, pain,and bleeding.   

## 2018-06-04 NOTE — Progress Notes (Signed)
Patient ID: Carla Marquez, female   DOB: 03/11/1981, 37 y.o.   MRN: [id number]  Feeling ctx.  AFVSS gen NAD FHTs 135, mod var, category 1-1, + accels toco q 4min  SVE 3/90/foley bulb  Continue IOL, expect SVD

## 2018-06-05 ENCOUNTER — Encounter (HOSPITAL_COMMUNITY)
Admission: RE | Disposition: A | Discharge status: Home / Self Care | Payer: Self-pay | Source: Home / Self Care | Attending: Obstetrics and Gynecology

## 2018-06-05 LAB — CBC
HCT: 24.7 % — ABNORMAL LOW (ref 36.0–46.0)
Hemoglobin: 7.9 g/dL — ABNORMAL LOW (ref 12.0–15.0)
MCH: 25.3 pg — AB (ref 26.0–34.0)
MCHC: 32 g/dL (ref 30.0–36.0)
MCV: 79.2 fL (ref 78.0–100.0)
PLATELETS: 237 10*3/uL (ref 150–400)
RBC: 3.12 MIL/uL — AB (ref 3.87–5.11)
RDW: 17.9 % — ABNORMAL HIGH (ref 11.5–15.5)
WBC: 11.8 10*3/uL — ABNORMAL HIGH (ref 4.0–10.5)

## 2018-06-05 LAB — RPR: RPR Ser Ql: NONREACTIVE

## 2018-06-05 SURGERY — LIGATION, FALLOPIAN TUBE, POSTPARTUM
Anesthesia: Choice | Laterality: Bilateral

## 2018-06-05 MED ORDER — DIBUCAINE 1 % RE OINT
1.0000 | TOPICAL_OINTMENT | RECTAL | Status: DC | PRN
Start: 2018-06-05 — End: 2018-06-06

## 2018-06-05 MED ORDER — LACTATED RINGERS IV SOLN: INTRAVENOUS | Status: DC

## 2018-06-05 MED ORDER — TETANUS-DIPHTH-ACELL PERTUSSIS 5-2.5-18.5 LF-MCG/0.5 IM SUSP: 0.5000 mL | Freq: Once | INTRAMUSCULAR | Status: DC

## 2018-06-05 MED ORDER — IBUPROFEN 600 MG PO TABS
600.0000 mg | ORAL_TABLET | Freq: Four times a day (QID) | ORAL | Status: DC
  Administered 2018-06-05 – 2018-06-06 (×6): 600 mg via ORAL
  Filled 2018-06-05 (×6): qty 1

## 2018-06-05 MED ORDER — ACETAMINOPHEN 325 MG PO TABS
650.0000 mg | ORAL_TABLET | ORAL | Status: DC | PRN
  Administered 2018-06-05 (×2): 650 mg via ORAL
  Filled 2018-06-05 (×2): qty 2

## 2018-06-05 MED ORDER — PRENATAL MULTIVITAMIN CH
1.0000 | ORAL_TABLET | Freq: Every day | ORAL | Status: DC
  Administered 2018-06-05: 1 via ORAL
  Filled 2018-06-05: qty 1

## 2018-06-05 MED ORDER — FAMOTIDINE 20 MG PO TABS: 40.0000 mg | ORAL_TABLET | Freq: Once | ORAL | Status: DC

## 2018-06-05 MED ORDER — ONDANSETRON HCL 4 MG PO TABS: 4.0000 mg | ORAL_TABLET | ORAL | Status: DC | PRN

## 2018-06-05 MED ORDER — ONDANSETRON HCL 4 MG/2ML IJ SOLN: 4.0000 mg | INTRAMUSCULAR | Status: DC | PRN

## 2018-06-05 MED ORDER — WITCH HAZEL-GLYCERIN EX PADS: 1.0000 "application " | MEDICATED_PAD | CUTANEOUS | Status: DC | PRN

## 2018-06-05 MED ORDER — SENNOSIDES-DOCUSATE SODIUM 8.6-50 MG PO TABS
2.0000 | ORAL_TABLET | ORAL | Status: DC
  Filled 2018-06-05: qty 2

## 2018-06-05 MED ORDER — COCONUT OIL OIL: 1.0000 "application " | TOPICAL_OIL | Status: DC | PRN

## 2018-06-05 MED ORDER — BENZOCAINE-MENTHOL 20-0.5 % EX AERO
1.0000 "application " | INHALATION_SPRAY | CUTANEOUS | Status: DC | PRN
  Administered 2018-06-05: 1 via TOPICAL
  Filled 2018-06-05: qty 56

## 2018-06-05 MED ORDER — SIMETHICONE 80 MG PO CHEW: 80.0000 mg | CHEWABLE_TABLET | ORAL | Status: DC | PRN

## 2018-06-05 MED ORDER — ZOLPIDEM TARTRATE 5 MG PO TABS: 5.0000 mg | ORAL_TABLET | Freq: Every evening | ORAL | Status: DC | PRN

## 2018-06-05 MED ORDER — DIPHENHYDRAMINE HCL 25 MG PO CAPS: 25.0000 mg | ORAL_CAPSULE | Freq: Four times a day (QID) | ORAL | Status: DC | PRN

## 2018-06-05 MED ORDER — METOCLOPRAMIDE HCL 10 MG PO TABS: 10.0000 mg | ORAL_TABLET | Freq: Once | ORAL | Status: DC

## 2018-06-05 MED ORDER — OXYCODONE HCL 5 MG PO TABS: 5.0000 mg | ORAL_TABLET | ORAL | Status: DC | PRN

## 2018-06-05 MED ORDER — OXYCODONE HCL 5 MG PO TABS: 10.0000 mg | ORAL_TABLET | ORAL | Status: DC | PRN

## 2018-06-05 NOTE — Lactation Note (Signed)
This note was copied from a baby's chart. Lactation Consultation Note  Patient Name: Carla Marquez [id number]'s Date: 06/05/2018 Reason for consult: Initial assessment;Term Breastfeeding consultation services and support information given.  This is mom's fifth baby and she breastfed the last two.  She reports it went well.  Newborn is 3712 hours old and latching well per mom.  Mom is a Producer, [employer] Employee.  Medela pump in style provided for home use.  Instructed to feed with feeding cues and call for assist/concerns prn.  Maternal Data Has patient been taught Hand Expression?: Yes Does the patient have breastfeeding experience prior to this delivery?: Yes  Feeding Feeding Type: Breast Fed Length of feed: 10 min  LATCH Score                   Interventions    Lactation Tools Discussed/Used     Consult Status Consult Status: Follow-up Date: 06/06/18 Follow-up type: In-patient    Huston FoleyMOULDEN, Kimori Tartaglia S 06/05/2018, 10:24 AM

## 2018-06-05 NOTE — Progress Notes (Signed)
Post Partum Day 1 Subjective: no complaints, up ad lib, voiding, tolerating PO and nl lochia, pain controlled.  Pt has decided against BTL.   Objective: Blood pressure 98/64, pulse 90, temperature 98.3 F (36.8 C), temperature source Oral, resp. rate 18, height 5\' 2"  (1.575 m), weight 77.3 kg, SpO2 100 %, unknown if currently breastfeeding.  Physical Exam:  General: alert and no distress Lochia: appropriate Uterine Fundus: firm  Recent Labs    06/04/18 0824 06/05/18 0630  HGB 9.5* 7.9*  HCT 29.3* 24.7*    Assessment/Plan: Plan for discharge tomorrow, Breastfeeding and Lactation consult.  routin ePP care D/w pt BTL - she declines   LOS: 1 day   Carla Marquez 06/05/2018, 8:26 AM

## 2018-06-05 NOTE — Anesthesia Postprocedure Evaluation (Signed)
Anesthesia Post Note  Patient: Carla Marquez  Procedure(s) Performed: AN AD HOC LABOR EPIDURAL     Patient location during evaluation: Mother Baby Anesthesia Type: Epidural Level of consciousness: awake and alert Pain management: pain level controlled Vital Signs Assessment: post-procedure vital signs reviewed and stable Respiratory status: spontaneous breathing, nonlabored ventilation and respiratory function stable Cardiovascular status: stable Postop Assessment: no headache, no backache, epidural receding and patient able to bend at knees Anesthetic complications: no    Last Vitals:  Vitals:   06/05/18 1224 06/05/18 1517  BP: 105/67 105/72  Pulse: 79 86  Resp: 18 16  Temp: 36.5 C 36.7 C  SpO2: 100%     Last Pain:  Vitals:   06/05/18 1517  TempSrc: Oral  PainSc:    Pain Goal: Patients Stated Pain Goal: 2 (06/05/18 0446)               Rica RecordsICKELTON,Adysson Revelle

## 2018-06-05 NOTE — Progress Notes (Signed)
Called Dr. Emeline DarlingBovard's phone to relay message that pt states she has changed her mind about having tubal.

## 2018-06-05 NOTE — Progress Notes (Signed)
CSW received consult for [name] due to history of situational anxiety. CSW met with [name] and [name] at bedside to complete discussion. [name] confirms a history of situational anxiety and that she deals with it on her own with self coping strategies. 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 [name] reports that she will be out of work for only six weeks. [name] reports a good and stable mood since delivery. [name] denies the need for medication or therapy. CSW and [name] discussed postpartum depression versus baby blues period. CSW encouraged [name] to reach out at anytime for assistance if needs arise, [name] stated agreement.  Carla Marquez, MSW, [place] Social Worker [facility] [phone]

## 2018-06-05 NOTE — Progress Notes (Signed)
Pt stated to RN that she is unsure of post-partum tubal at this time. Instructed to remain NPO and she can discuss with Dr. Ellyn HackBovard this morning. Pt agreeable and will continue to think about what she wants to do.

## 2018-06-06 ENCOUNTER — Ambulatory Visit: Payer: Self-pay

## 2018-06-06 MED ORDER — IBUPROFEN 600 MG PO TABS: 600.0000 mg | ORAL_TABLET | Freq: Four times a day (QID) | ORAL | 0 refills | Status: DC

## 2018-06-06 MED ORDER — ACETAMINOPHEN 325 MG PO TABS: 650.0000 mg | ORAL_TABLET | ORAL | 1 refills | Status: AC | PRN

## 2018-06-06 MED FILL — Sodium Chloride IV Soln 0.9%: INTRAVENOUS | Qty: 1000 | Status: AC

## 2018-06-06 MED FILL — Penicillin G Potassium For Inj 5000000 Unit: INTRAMUSCULAR | Qty: 3 | Status: AC

## 2018-06-06 MED FILL — Sodium Chloride IV Soln 0.9%: INTRAVENOUS | Qty: 100 | Status: AC

## 2018-06-06 NOTE — Lactation Note (Signed)
This note was copied from a baby's chart. Lactation Consultation Note; Mother reports that infant is feeding well. Infant is latched on the right breast with observed suckling and swallows.  Mother denies having any nipple tenderness. Encouraged mother to continue to hand express.  Advised mother to cue base feed and feed infant 8-12 times in 24 hours.  Reviewed treatment and  prevention of engorgement.  Mother was given a harmony hand pump with instructions.  [facility] ; BFS[facility], outpatient dept and phone line for questions or concerns.   Patient Name: Carla Marquez [id number]'s Date: 06/06/2018 Reason for consult: Follow-up assessment   Maternal Data    Feeding Feeding Type: Breast Fed  LATCH Score                   Interventions    Lactation Tools Discussed/Used     Consult Status Consult Status: Complete    Michel BickersKendrick, Won Kreuzer McCoy 06/06/2018, 11:05 AM

## 2018-06-06 NOTE — Plan of Care (Signed)
Progressing appropriately. Encouraged to call for assistance as needed, and for LATCH assessment.  

## 2018-06-06 NOTE — Discharge Summary (Signed)
OB Discharge Summary     Patient Name: Carla Marquez DOB: [birth year] MRN: [id number]  Date of admission: 06/04/2018 Delivering MD: Sherian ReinBOVARD-STUCKERT, JODY   Date of discharge: 06/06/2018  Admitting diagnosis: Induction Intrauterine pregnancy: 4930w2d     Secondary diagnosis:  Principal Problem:   SVD (spontaneous vaginal delivery) Active Problems:   Pregnancy  Additional problems: GDM-diet controlled                                     +GBS     Discharge diagnosis: Term Pregnancy Delivered   VBAC                                                                                             Post partum procedures:none  Augmentation: AROM, Pitocin and Foley Balloon  Complications: None  Hospital course:  Induction of Labor With Vaginal Delivery   37 y.o. yo [id number] at 7330w2d was admitted to the hospital 06/04/2018 for induction of labor.  Indication for induction: Favorable cervix at term and A2 DM.  Patient had an uncomplicated labor course as follows: Membrane Rupture Time/Date: 6:44 PM ,06/04/2018   Intrapartum Procedures: Episiotomy: None [1]                                         Lacerations:  Periurethral [8]  Patient had delivery of a Viable infant.  Information for the patient's newborn:  Carla Marquez, Boy Miana [[id number]][[id number]]  Delivery Method: VBAC, Spontaneous(Filed from Delivery Summary)   06/04/2018  Details of delivery can be found in separate delivery note.  Patient had a routine postpartum course. Patient is discharged home 06/06/18.  Physical exam  Vitals:   06/05/18 1224 06/05/18 1517 06/05/18 2137 06/06/18 0544  BP: 105/67 105/72 123/79 99/64  Pulse: 79 86 65 84  Resp: 18 16  18   Temp: 97.7 F (36.5 C) 98 F (36.7 C) 98.7 F (37.1 C) 98 F (36.7 C)  TempSrc: Oral Oral Oral Oral  SpO2: 100%  100%   Weight:      Height:       General: alert and cooperative Lochia: appropriate Uterine Fundus: firm  Labs: Lab Results  Component Value Date   WBC 11.8  (H) 06/05/2018   HGB 7.9 (L) 06/05/2018   HCT 24.7 (L) 06/05/2018   MCV 79.2 06/05/2018   PLT 237 06/05/2018   No flowsheet data found.  Discharge instruction: per After Visit Summary and "Baby and Me Booklet".  After visit meds:  Allergies as of 06/06/2018   No Known Allergies     Medication List    STOP taking these medications   metFORMIN 500 MG tablet Commonly known as:  GLUCOPHAGE     TAKE these medications   acetaminophen 325 MG tablet Commonly known as:  TYLENOL Take 2 tablets (650 mg total) by mouth every 4 (four) hours as needed (for pain scale < 4).   ibuprofen 600 MG tablet Commonly  known as:  ADVIL,MOTRIN Take 1 tablet (600 mg total) by mouth every 6 (six) hours.   prenatal multivitamin Tabs tablet Take 1 tablet by mouth daily at 12 noon.       Diet: routine diet  Activity: Advance as tolerated. Pelvic rest for 6 weeks.   Outpatient follow up:6 weeks Follow up Appt:No future appointments. Follow up Visit:No follow-ups on file.  Postpartum contraception: Progesterone only pills  Newborn Data: Live born female  Birth Weight: 6 lb 14.1 oz (3120 g) APGAR: 8, 9  Newborn Delivery   Birth date/time:  06/04/2018 21:37:00 Delivery type:  VBAC, Spontaneous     Baby Feeding: Breast Disposition:home with mother   Carla ReaUnsure if will proceed with circumcision, knows if desires needs to call and set up in next week   06/06/2018 Oliver PilaKathy W Keyshaun Exley, MD

## 2018-06-06 NOTE — Progress Notes (Signed)
Post Partum Day 2 Subjective: no complaints, up ad lib and tolerating PO  Ambulating fine with no dizziness  Objective: Blood pressure 99/64, pulse 84, temperature 98 F (36.7 C), temperature source Oral, resp. rate 18, height 5\' 2"  (1.575 m), weight 77.3 kg, SpO2 100 %, unknown if currently breastfeeding.  Physical Exam:  General: alert and cooperative Lochia: appropriate Uterine Fundus: firm   Recent Labs    06/04/18 0824 06/05/18 0630  HGB 9.5* 7.9*  HCT 29.3* 24.7*    Assessment/Plan: Discharge home  Coping well with anemia, no sx States will likely do POP for birth control Not sure if will do circumcision, instructed to call and set up in next week if desires   LOS: 2 days   Oliver PilaKathy W Rayel Santizo 06/06/2018, 8:43 AM

## 2018-06-22 ENCOUNTER — Ambulatory Visit: Payer: Self-pay

## 2018-06-22 NOTE — Lactation Note (Signed)
This note was copied from a baby's chart. 06/22/2018  Name: Carla Marquez MRN: [id number] Date of Birth: 06/04/2017 Gestational Age: Gestational Age: [redacted]w[redacted]d Birth Weight: 110.1 oz Weight today:    6 pounds 9.5 ounces (2990 grams) with clean size 1 diaper  Infant presents today with mom for feeding assessment. Infant has not been gaining weight and is below birthweight at 61 days old.   Infant has gained 60 grams in the last 16 days with an average daily weight gain of 3.75 grams a day.   Mom started formula last week after doctors appointment and infant noted to not be gaining adequately.   Mom reports she has not been pumping as "he will not let me". Infant needing to be fed often and wants to be held post feeding  Infant is feeding at the breast every 1.5-2 hours and nurses on both breasts with each feeding. Mom reports he is more alert and more active at the breast in the last few days. Mom reports he sometimes acts hungry post BF, that is when she supplements with formula bottles.   Mom with large long nipples. Infant tends to get on the nipple shallowly and with a narrow gape. When lips are flanged, he still pulls back on the nipple and off the areola. Nipple are slightly compressed post feeding. Mom denies pain with feeding.   Infant noted to have thin labial frenulum that wraps around the gum ridge. upper lip blanches with flanging and has a red ring after nursing. He is also noted to have a short posterior lingual frenulum. Infant with good tongue extension and lateralization. Infant with limited mid tongue elevation. Infant with strong suckle on gloved finger with tongue supping and extension noted. Infant with high palate. Infant clicks at the breast and nipple is slightly compressed post feeding. Mom denies pain with feeding. Mom was shown structures in his mouth, She was informed how restrictions can effect BF, milk transfer and milk supply. Mom given website information and local  providers.   Infant fed on the left breast. He was noted to have swallows. Infant pulled on and off the breast. He was noted to click throughout the feeding. Infant with some swallows, weight did not change, however it was noted that infant voided out of his diaper onto the pillow after feeding, void was unmeasurable. He was then placed to breast with the 5 french feeding tube, he fed for about 10 minutes and transferred 12 ml from the syringe and 2 ml from mom. Infant pulling on and off so we swotched him to the right breast. He feeds off and on with minimal swallows noted. Infant transferred 12 ml. Infant able to stay deeper on the right breast with feeding with flanged lips. Right nipple is smaller that the left nipple.  He was still hungry post feeding, infant took 10-15 ml from the bottle and tolerated it well.   LC feels that infant tongue restriction coupled with the larger nipples on the mom has contributed to decreased transfer from the breast which then allowed mom's milk supply to dwindle over time. Reviewed importance of mom pumping to protect milk supply and to increase infant calories to allow him to grow and be more alert. Infant has started gaining weight since being supplemented a few times a day since last week. Infant lowest weight was 6 pounds 3 ounces per mom and he is up to 6 pounds 9.5 ounces today.   Reviewed not allowing infant to feed  all the time and to limit BF to 30 minutes and add supplementation post BF. Reviewed importance of protecting milk supply until infant transferring better. Information on [place] given for mom to talk with OB prior to taking. Enc mom to switch to the Dr. Theora Gianotti Nipples for feeding.   Mom reports she is planning to switch pediatricians to [facility] but has not made a follow up appt yet, she is planning to call and schedule follow up appt. [facility] to return on Friday for follow up weight check. Mom aware of the BF Support Groups at [facility].  Infant to follow up with Lactation in 1 week.   Mom reports all questions/concerns have been answered. Mom to call with questions/concerns as needed.     General Information: Mother's reason for visit: feeding assessment, infant not gaining well Consult: Initial Lactation consultant: Noralee Stain RN,IBCLC Breastfeeding experience: BF has improved, he is starting to gain weight  Maternal medical conditions: Gestational diabetes mellitus Maternal medications: Pre-natal vitamin  Breastfeeding History: Frequency of breast feeding: every 1.5-2 hours, takes both breasts, mostly self awakens Duration of feeding: 20-30 minutes  Supplementation: Supplement method: bottle(hospital nipple, enc mom to change to Dr. Theora Gianotti Nipples) Brand: Enfamil(Neuro Pro) Formula volume: 1-2 ounces  Formula frequency: BID Total formula volume per day: 2-4 ounces Breast milk volume: wtih BF only     Pump type: Medela pump in style Pump frequency: once yesterday Pump volume: 1.5 ounces  Infant Output Assessment: Voids per 24 hours: 8-10  Urine color: Clear yellow Stools per 24 hours: 2 Stool color: Yellow  Breast Assessment: Breast: Soft, Compressible Nipple: Erect, Other(Large/Long) Pain level: 0 Pain interventions: Bra, Lanolin  Feeding Assessment: Infant oral assessment: Variance Infant oral assessment comment: Infant noted to have thin labial frenulum that wraps around the gum ridge. upper lip blanches with flanging and has a red ring after nursing. He is also noted to have a short posterior lingual frenulum. Infant with good tongue extension and lateralization. Infant with limited mid tongue elevation. Infant with strong suckle on gloved finger with tongue supping and extension noted. Infant with high palate. Infant clicks at the breast and nipple is slightly compressed post feeding. Mom denies pain with feeding. Positioning: Cradle(left breast, 20 minutes) Latch: 1 - Repeated attempts needed  to sustain latch, nipple held in mouth throughout feeding, stimulation needed to elicit sucking reflex. Audible swallowing: 1 - A few with stimulation Type of nipple: 2 - Everted at rest and after stimulation Comfort: 2 - Soft/non-tender Hold: 1 - Assistance needed to correctly position infant at breast and maintain latch LATCH score: 7 Latch assessment: Shallow Lips flanged: No(needed both lips flanges) Suck assessment: Displays both Tools: Syringe with 5 Fr feeding tube Pre-feed weight: 2990 grams Post feed weight: 2998 grams (had void outside of diaper that was not able to be measured Amount transferred: 2 ml Amount supplemented: 12 ml with 5 french feeding tube  Additional Feeding Assessment: Infant oral assessment: Variance Infant oral assessment comment: Infant noted to have thin labial frenulum that wraps around the gum ridge. upper lip blanches with flanging and has a red ring after nursing. He is also noted to have a short posterior lingual frenulum. Infant with good tongue extension and lateralization. Infant with limited mid tongue elevation. Infant with strong suckle on gloved finger with tongue supping and extension noted. Infant with high palate. Infant clicks at the breast and nipple is slightly compressed post feeding. Mom denies pain with feeding. Positioning: Cross cradle(right  breast) Latch: 1 - Repeated attempts neede to sustain latch, nipple held in mouth throughout feeding, stimulation needed to elicit sucking reflex. Audible swallowing: 1 - A few with stimulation Type of nipple: 2 - Everted at rest and after stimulation Comfort: 2 - Soft/non-tender Hold: 2 - No assistance needed to correctly position infant at breast LATCH score: 8 Latch assessment: Deep Lips flanged: Yes Suck assessment: Displays both   Pre-feed weight: 2998 grams Post feed weight: 3010 grams Amount transferred: 12 ml Amount supplemented: 10 ml formula from bottle  Mom pumped 15-20 ml post  feeding.   Totals: Total amount transferred: 14 ml + Total supplement given: 10 ml     Ed BlalockSharon S Soila Printup RN, IBCLC                                                     Carla FloodSharon S Carla Marquez 06/22/2018, 10:22 AM

## 2018-06-29 ENCOUNTER — Ambulatory Visit: Payer: Self-pay

## 2018-06-29 NOTE — Lactation Note (Signed)
This note was copied from a baby's chart. 06/29/2018  Name: Carla Marquez MRN: [id number] Date of Birth: 06/04/2017 Gestational Age: Gestational Age: [redacted]w[redacted]d Birth Weight: 110.1 oz Weight today:    6 pounds 14.7 ounces (3138 grams) with clean newborn diaper  Legend is a 21 day old term Infant presents today with mom for follow up feeding assessment. Mom feels BF is going better than at previous visit.    Infant has gained 148 grams in the last 7 days with an average daily weight gain of 21 grams a day. Mom reports he weighed 6 pounds 13 ounces when weighed by [facility] on Friday 8/30, therefore a weight gain of 1.7 ounces in the last 5 days which is suboptimal based on infant age. Infant is not above birthweight.   Infant self awakens about every 2.5 hours to BF. He BF for 30 minutes total. Mom reports he is more alert than previously reported. Mom offers infant a bottle of breast milk and formula 4 x a day. He takes about 3.5 ounces per feeding.   Infant tires easily at the breast. He clicks throughout the feeding at the breast. Mom reports infant clicks on the bottle at times. He was noted to click on the bottle throughout feeding in the office. He does not drool or choke on the bottle. Nipples are slightly compressed post feedings. Infant does not transfer well at the breast. Infant with thin labial frenulum, upper lip flanges better today although still tight. Infant with thin,  short posterior lingual frenulum with limited mid tongue elevation. Infant with high palate. Infant with good tongue extension and lateralization. Infant with clicking on gloved finger, suck not as strong as last week. He does have tongue cupping and extension on gloved finger.   Mom reports they have thought of having tongue and lip looked at by Oral Specialist, they have not made any calls at this time. Mom reports she has contact information to do so. Mom to call and make follow up appt if infant tongue and lip  released. Mom plans to call provider tomorrow.   Mom is pumping 4 x a day and gets about 1.5 ounces per feeding. Mom tried the 5 french tube at home and has stopped using it, She is supplementing infant with a Dr. Theora Gianotti bottle post BF.   Mom has thought about taking Fenugreek. She reports it did not work with her older child. Reviewed mom talking with OB to inquire about Reglan if mom wants. Discussed that some OB's will not prescribe and that it can cause Depression and Psychosis in some mom's so everyone is not a candidate for taking.   Mom latched infant to the breast.  Enc mom to keep infant awake and actively feeding and to massage/compress breast with feeding. Nipple was slightly compressed post feeding. Infant with loud continuous clicking on the left breast. Infant weighed and then placed on the right breast. He then latched to the right breast with intermittent sucking bursts. Infant still clicking throughout feeding. When he became fussy, mom burped him and relatched him. Infant transferred an additional 10 ml. Infant took 60 ml from the bottle after BF. He seemed satisfied post feeding.   Enc mom to supplement infant after each BF as infant is not taking enough volume at the breast. Enc mom to continue working on her milk supply and to increase pumping as she is able.    [facility] does not plan to follow up at this  time. Infant has new Ped appt with [facility] with Dow Adolph on 9/12. Infant to follow up with Lactation as needed at mom's request and/or 1-2 days post tongue/lip release if completed.   Mom reports she has help at home with infant.      General Information: Mother's reason for visit: follow up feeding assessment Consult: Follow-up Lactation consultant: Noralee Stain RN,IBCLC Breastfeeding experience: BF has improved, supplementing with EBM and formula Maternal medical conditions: Gestational diabetes mellitus Maternal medications: Pre-natal  vitamin  Breastfeeding History: Frequency of breast feeding: every 2.5 hours, self awakens, both breasts for 30 minutes Duration of feeding: 30 minutes  Supplementation: Supplement method: bottle(Dr. Brown's ) Brand: Enfamil Formula volume: 3.5 ounces Formula frequency: 4 x a day Total formula volume per day: 12 + ounces Breast milk volume: 1.5 ounces Breast milk frequency: 3-4 x a day   Pump type: Medela pump in style Pump frequency: 4 x a day Pump volume: 1.5 ounces  Infant Output Assessment: Voids per 24 hours: 8-10 Urine color: Clear yellow Stools per 24 hours: 3-4 Stool color: Yellow  Breast Assessment: Breast: Soft, Compressible Nipple: Erect, Other(Large and long) Pain level: 0 Pain interventions: Bra, Lanolin  Feeding Assessment: Infant oral assessment: Variance Infant oral assessment comment: Infant tires easily at the breast. He clicks throughout the feeding at the breast. Mom reports infant clicks on the bottle at times. He was noted to click on the bottle throughout feeding in the office. He does not drool or choke on the bottle. Nipples are slightly compressed post feedings. Infant does not transfer well at the breast. Infant with thin labial frenulum, upper lip flanges better today although still tight. Infant with thin,  short posterior lingual frenulum with limited mid tongue elevation. Infant with high palate. Infant with good tongue extension and lateralization. Infant with clicking on gloved finger, suck not as strong as last week. He does have tongue cupping and extension on gloved finger.  Positioning: Cross cradle(left breast, 15 mintues) Latch: 1 - Repeated attempts needed to sustain latch, nipple held in mouth throughout feeding, stimulation needed to elicit sucking reflex. Audible swallowing: 1 - A few with stimulation Type of nipple: 2 - Everted at rest and after stimulation Comfort: 2 - Soft/non-tender Hold: 2 - No assistance needed to correctly  position infant at breast LATCH score: 8 Latch assessment: Deep Lips flanged: Yes Suck assessment: Displays both   Pre-feed weight: 3138 grams Post feed weight: 3150 grams Amount transferred: 12 ml Amount supplemented: 0  Additional Feeding Assessment: Infant oral assessment: Variance Infant oral assessment comment: Infant tires easily at the breast. He clicks throughout the feeding at the breast. Mom reports infant clicks on the bottle at times. He was noted to click on the bottle throughout feeding in the office. He does not drool or choke on the bottle. Nipples are slightly compressed post feedings. Infant does not transfer well at the breast. Infant with thin labial frenulum, upper lip flanges better today although still tight. Infant with thin,  short posterior lingual frenulum with limited mid tongue elevation. Infant with high palate. Infant with good tongue extension and lateralization. Infant with clicking on gloved finger, suck not as strong as last week. He does have tongue cupping and extension on gloved finger.  Positioning: Cross cradle(right breast, 15 minutes) Latch: 1 - Repeated attempts neede to sustain latch, nipple held in mouth throughout feeding, stimulation needed to elicit sucking reflex. Audible swallowing: 1 - A few with stimulation Type  of nipple: 2 - Everted at rest and after stimulation Comfort: 2 - Soft/non-tender Hold: 2 - No assistance needed to correctly position infant at breast LATCH score: 8 Latch assessment: Deep Lips flanged: Yes Suck assessment: Displays both   Pre-feed weight: 3150 grams Post feed weight: 3158 grams Amount transferred: 8 ml Amount supplemented: 60 ml  Totals: Total amount transferred: 20 ml Total supplement given: 60 ml Total amount pumped post feed: did not pump   Plan:  1. Offer breast with feeding cues, limit breast feeding to 30 minutes total 2. Keep infant awake at the breast as needed 3. Massage/compress breast with  feeding to maximize milk transfer 4. Offer both breasts with feeding, empty first breast before offering second breast 5. Offer infant bottle of breast milk and/or formula after each breast feeding, infant needs at least 2 ounces of supplement per feeding, he can have more if he wants it 7. Would recommend that you pump after breast feeding 6-8 x a day for 15-20 minutes with double electric breast pump to protect and promote milk supply 8. Infant needs 58-78 ml (2-2.5 ounces) for 8 feedings a day or 465-620 ml (16-43ml) in 24 hours. He may take more or less at a feeding depending on how often he feeds 9. When using a bottle, use a slower flow nipple such as Dr. Theora Gianotti 10. Use the Paced bottle feeding method to supplement infant (video on [email or web address].[email or web address]) 11. Once pumping, if you do not have enough milk to feed infant, Fenugreek supplementation may be helpful to increase supply, please speak with OB prior to taking. The recommended dosage for milk production if 2-3 610 mg capsules three times a day 12. Keep up with good work 13. Thank you for allowing me to assist you today  14. Please call with any questions/concerns as needed [phone] 15. Follow up with Lactation 1-2 days post tongue/lip release if completed and as needed  Ed Blalock RN, IBCLC                                                     Carla Marquez 06/29/2018, 11:40 AM

## 2018-07-15 DIAGNOSIS — Z130 Encounter for screening for diseases of the blood and blood-forming organs and certain disorders involving the immune mechanism: Secondary | ICD-10-CM | POA: Diagnosis not present

## 2018-07-15 DIAGNOSIS — Z1389 Encounter for screening for other disorder: Secondary | ICD-10-CM | POA: Diagnosis not present

## 2018-07-15 DIAGNOSIS — Z8632 Personal history of gestational diabetes: Secondary | ICD-10-CM | POA: Diagnosis not present

## 2018-07-15 DIAGNOSIS — Z3009 Encounter for other general counseling and advice on contraception: Secondary | ICD-10-CM | POA: Diagnosis not present

## 2018-07-15 DIAGNOSIS — Z833 Family history of diabetes mellitus: Secondary | ICD-10-CM | POA: Diagnosis not present

## 2018-08-16 DIAGNOSIS — H52223 Regular astigmatism, bilateral: Secondary | ICD-10-CM | POA: Diagnosis not present

## 2018-08-16 DIAGNOSIS — H5213 Myopia, bilateral: Secondary | ICD-10-CM | POA: Diagnosis not present

## 2019-12-14 ENCOUNTER — Inpatient Hospital Stay (HOSPITAL_COMMUNITY)
Admission: AD | Admit: 2019-12-14 | Discharge: 2019-12-15 | Disposition: A | Discharge status: Home / Self Care | Payer: 59 | Attending: Obstetrics and Gynecology | Admitting: Obstetrics and Gynecology

## 2019-12-14 ENCOUNTER — Other Ambulatory Visit: Payer: Self-pay

## 2019-12-14 ENCOUNTER — Inpatient Hospital Stay (HOSPITAL_COMMUNITY): Payer: 59

## 2019-12-14 ENCOUNTER — Encounter (HOSPITAL_COMMUNITY): Payer: Self-pay | Admitting: *Deleted

## 2019-12-14 DIAGNOSIS — Z8632 Personal history of gestational diabetes: Secondary | ICD-10-CM | POA: Diagnosis not present

## 2019-12-14 DIAGNOSIS — O09522 Supervision of elderly multigravida, second trimester: Secondary | ICD-10-CM | POA: Diagnosis not present

## 2019-12-14 DIAGNOSIS — R109 Unspecified abdominal pain: Secondary | ICD-10-CM | POA: Diagnosis present

## 2019-12-14 DIAGNOSIS — O021 Missed abortion: Secondary | ICD-10-CM | POA: Diagnosis not present

## 2019-12-14 DIAGNOSIS — A5901 Trichomonal vulvovaginitis: Secondary | ICD-10-CM | POA: Diagnosis not present

## 2019-12-14 DIAGNOSIS — O209 Hemorrhage in early pregnancy, unspecified: Secondary | ICD-10-CM | POA: Diagnosis not present

## 2019-12-14 DIAGNOSIS — O4692 Antepartum hemorrhage, unspecified, second trimester: Secondary | ICD-10-CM

## 2019-12-14 DIAGNOSIS — O98311 Other infections with a predominantly sexual mode of transmission complicating pregnancy, first trimester: Secondary | ICD-10-CM | POA: Insufficient documentation

## 2019-12-14 DIAGNOSIS — Z3A14 14 weeks gestation of pregnancy: Secondary | ICD-10-CM | POA: Insufficient documentation

## 2019-12-14 DIAGNOSIS — Z9889 Other specified postprocedural states: Secondary | ICD-10-CM | POA: Insufficient documentation

## 2019-12-14 DIAGNOSIS — Z3A01 Less than 8 weeks gestation of pregnancy: Secondary | ICD-10-CM | POA: Diagnosis not present

## 2019-12-14 LAB — URINALYSIS, ROUTINE W REFLEX MICROSCOPIC
Bacteria, UA: NONE SEEN
Bilirubin Urine: NEGATIVE
Glucose, UA: NEGATIVE mg/dL
Ketones, ur: NEGATIVE mg/dL
Leukocytes,Ua: NEGATIVE
Nitrite: NEGATIVE
Protein, ur: NEGATIVE mg/dL
Specific Gravity, Urine: 1.016 (ref 1.005–1.030)
pH: 6 (ref 5.0–8.0)

## 2019-12-14 LAB — CBC WITH DIFFERENTIAL/PLATELET
Abs Immature Granulocytes: 0.02 10*3/uL (ref 0.00–0.07)
Basophils Absolute: 0 10*3/uL (ref 0.0–0.1)
Basophils Relative: 0 %
Eosinophils Absolute: 0.3 10*3/uL (ref 0.0–0.5)
Eosinophils Relative: 4 %
HCT: 37.9 % (ref 36.0–46.0)
Hemoglobin: 12.5 g/dL (ref 12.0–15.0)
Immature Granulocytes: 0 %
Lymphocytes Relative: 31 %
Lymphs Abs: 2.1 10*3/uL (ref 0.7–4.0)
MCH: 29.9 pg (ref 26.0–34.0)
MCHC: 33 g/dL (ref 30.0–36.0)
MCV: 90.7 fL (ref 80.0–100.0)
Monocytes Absolute: 0.4 10*3/uL (ref 0.1–1.0)
Monocytes Relative: 6 %
Neutro Abs: 4 10*3/uL (ref 1.7–7.7)
Neutrophils Relative %: 59 %
Platelets: 291 10*3/uL (ref 150–400)
RBC: 4.18 MIL/uL (ref 3.87–5.11)
RDW: 13.3 % (ref 11.5–15.5)
WBC: 6.9 10*3/uL (ref 4.0–10.5)
nRBC: 0 % (ref 0.0–0.2)

## 2019-12-14 LAB — WET PREP, GENITAL
Clue Cells Wet Prep HPF POC: NONE SEEN
Sperm: NONE SEEN
Yeast Wet Prep HPF POC: NONE SEEN

## 2019-12-14 LAB — HCG, QUANTITATIVE, PREGNANCY: hCG, Beta Chain, Quant, S: 2224 m[IU]/mL — ABNORMAL HIGH (ref <5)

## 2019-12-14 LAB — RAPID HIV SCREEN (HIV 1/2 AB+AG)
HIV 1/2 Antibodies: NONREACTIVE
HIV-1 P24 Antigen - HIV24: NONREACTIVE

## 2019-12-14 LAB — POC URINE PREG, ED: Preg Test, Ur: POSITIVE — AB

## 2019-12-14 MED ORDER — OXYCODONE-ACETAMINOPHEN 5-325 MG PO TABS
2.0000 | ORAL_TABLET | Freq: Once | ORAL | Status: AC
  Administered 2019-12-14: 2 via ORAL
  Filled 2019-12-14: qty 2

## 2019-12-14 MED ORDER — METRONIDAZOLE 500 MG PO TABS
2000.0000 mg | ORAL_TABLET | Freq: Once | ORAL | Status: AC
  Administered 2019-12-14: 2000 mg via ORAL
  Filled 2019-12-14: qty 4

## 2019-12-14 NOTE — ED Triage Notes (Signed)
Pt reports abdominal cramping and spotting today, says she has not had a period since November, took a pregnancy test just after Christmas and it was positive. Has not yet seen her OBGYN.  Lower abdominal cramping and spotting today, G6P5.

## 2019-12-14 NOTE — MAU Note (Addendum)
Having lower abd cramping today. Went to [facility] and had light spotting. Cramping got worse tonight and spotting alittle more. Pain alittle worse RLQ. Plans to go to [facility] for [facility] but no appt yet

## 2019-12-14 NOTE — MAU Provider Note (Signed)
Chief Complaint: Abdominal Pain   First Provider Initiated Contact with Patient 12/14/19 2214        SUBJECTIVE HPI: Carla Marquez is a 39 y.o. [id number] at [redacted]w[redacted]d by LMP who presents to maternity admissions reporting lower abdominal cramping with light spotting today.  Cramping got worse tonight.  Pain is worse in RLQ. Marland Kitchen She denies vaginal itching/burning, urinary symptoms, h/a, dizziness, n/v, or fever/chills.    Abdominal Pain This is a new problem. The current episode started today. The onset quality is gradual. The problem occurs intermittently. The problem has been unchanged. The pain is located in the RLQ and suprapubic region. The pain is moderate. The quality of the pain is cramping. The abdominal pain does not radiate. Pertinent negatives include no anorexia, constipation, diarrhea, dysuria, fever, frequency, myalgias, nausea or vomiting. The pain is aggravated by palpation. The pain is relieved by nothing. She has tried nothing for the symptoms.   RN Note: Having lower abd cramping today. Went to [facility] and had light spotting. Cramping got worse tonight and spotting alittle more. Pain alittle worse RLQ. Plans to go to [facility] for [facility] but no appt yet  Past Medical History:  Diagnosis Date  . AMA (advanced maternal age) multigravida 48+   . Anemia   . Asthma    Childhood  . Gestational diabetes    metformin  . Hx of gonorrhea   . SVD (spontaneous vaginal delivery) 06/04/2018  . Vaginal birth after cesarean (VBAC) (3/27) 01/20/2015   Past Surgical History:  Procedure Laterality Date  . CESAREAN SECTION     x2   Social History   Socioeconomic History  . Marital status: Single    Spouse name: Not on file  . Number of children: 4  . Years of education: 57  . Highest education level: Not on file  Occupational History  . Occupation: Chartered certified accountant  Tobacco Use  . Smoking status: Never Smoker  . Smokeless tobacco: Never Used  Substance and Sexual Activity  . Alcohol use: No   . Drug use: No  . Sexual activity: Yes  Other Topics Concern  . Not on file  Social History Narrative   Fun/Hobby: Read, Go to movies   Denies abuse and feels safe at home.    Social Determinants of Health   Financial Resource Strain:   . Difficulty of Paying Living Expenses: Not on file  Food Insecurity:   . Worried About Charity fundraiser in the Last Year: Not on file  . Ran Out of Food in the Last Year: Not on file  Transportation Needs:   . Lack of Transportation (Medical): Not on file  . Lack of Transportation (Non-Medical): Not on file  Physical Activity:   . Days of Exercise per Week: Not on file  . Minutes of Exercise per Session: Not on file  Stress:   . Feeling of Stress : Not on file  Social Connections:   . Frequency of Communication with Friends and Family: Not on file  . Frequency of Social Gatherings with Friends and Family: Not on file  . Attends Religious Services: Not on file  . Active Member of Clubs or Organizations: Not on file  . Attends Archivist Meetings: Not on file  . Marital Status: Not on file  Intimate Partner Violence:   . Fear of Current or Ex-Partner: Not on file  . Emotionally Abused: Not on file  . Physically Abused: Not on file  . Sexually Abused:  Not on file   No current facility-administered medications on file prior to encounter.   Current Outpatient Medications on File Prior to Encounter  Medication Sig Dispense Refill  . acetaminophen (TYLENOL) 325 MG tablet Take 2 tablets (650 mg total) by mouth every 4 (four) hours as needed (for pain scale < 4). 30 tablet 1  . ibuprofen (ADVIL,MOTRIN) 600 MG tablet Take 1 tablet (600 mg total) by mouth every 6 (six) hours. 30 tablet 0  . Prenatal Vit-Fe Fumarate-FA (PRENATAL MULTIVITAMIN) TABS tablet Take 1 tablet by mouth daily at 12 noon.     No Known Allergies  I have reviewed patient's Past Medical Hx, Surgical Hx, Family Hx, Social Hx, medications and allergies.   ROS:   Review of Systems  Constitutional: Negative for fever.  Gastrointestinal: Positive for abdominal pain. Negative for anorexia, constipation, diarrhea, nausea and vomiting.  Genitourinary: Negative for dysuria and frequency.  Musculoskeletal: Negative for myalgias.   Review of Systems  Other systems negative   Physical Exam  Physical Exam Patient Vitals for the past 24 hrs:  BP Temp Temp src Pulse Resp SpO2 Height Weight  12/14/19 2201 130/72 -- -- 85 -- -- -- --  12/14/19 2157 -- 99.7 F (37.6 C) -- -- 18 -- 5\' 2"  (1.575 m) 78 kg  12/14/19 2111 129/89 100.2 F (37.9 C) Oral 99 18 99 % -- --   Constitutional: Well-developed, well-nourished female in no acute distress.  Cardiovascular: normal rate Respiratory: normal effort GI: Abd soft, non-tender. No guarding or rebound tenderness MS: Extremities nontender, no edema, normal ROM Neurologic: Alert and oriented x 4.  GU: Neg CVAT.  PELVIC EXAM: Cervix pink, visually closed, without lesion, scant red discharge, vaginal walls and external genitalia normal Bimanual exam: Cervix 0/long/high, firm, anterior, neg CMT, uterus slightly tender, nonenlarged, adnexa with tenderness, mostly on RLQ, no enlargement, or mass    LAB RESULTS Results for orders placed or performed during the hospital encounter of 12/14/19 (from the past 24 hour(s))  POC Urine Pregnancy, ED (not at [facility])     Status: Abnormal   Collection Time: 12/14/19  9:37 PM  Result Value Ref Range   Preg Test, Ur POSITIVE (A) NEGATIVE  Urinalysis, Routine w reflex microscopic     Status: Abnormal   Collection Time: 12/14/19 10:07 PM  Result Value Ref Range   Color, Urine YELLOW YELLOW   APPearance HAZY (A) CLEAR   Specific Gravity, Urine 1.016 1.005 - 1.030   pH 6.0 5.0 - 8.0   Glucose, UA NEGATIVE NEGATIVE mg/dL   Hgb urine dipstick LARGE (A) NEGATIVE   Bilirubin Urine NEGATIVE NEGATIVE   Ketones, ur NEGATIVE NEGATIVE mg/dL   Protein, ur NEGATIVE NEGATIVE mg/dL    Nitrite NEGATIVE NEGATIVE   Leukocytes,Ua NEGATIVE NEGATIVE   RBC / HPF 11-20 0 - 5 RBC/hpf   WBC, UA 6-10 0 - 5 WBC/hpf   Bacteria, UA NONE SEEN NONE SEEN   Squamous Epithelial / LPF 0-5 0 - 5   Mucus PRESENT   Wet prep, genital     Status: Abnormal   Collection Time: 12/14/19 10:31 PM   Specimen: Vaginal  Result Value Ref Range   Yeast Wet Prep HPF POC NONE SEEN NONE SEEN   Trich, Wet Prep PRESENT (A) NONE SEEN   Clue Cells Wet Prep HPF POC NONE SEEN NONE SEEN   WBC, Wet Prep HPF POC FEW (A) NONE SEEN   Sperm NONE SEEN   CBC with Differential/Platelet  Status: None   Collection Time: 12/14/19 10:47 PM  Result Value Ref Range   WBC 6.9 4.0 - 10.5 K/uL   RBC 4.18 3.87 - 5.11 MIL/uL   Hemoglobin 12.5 12.0 - 15.0 g/dL   HCT 75.6 43.3 - 29.5 %   MCV 90.7 80.0 - 100.0 fL   MCH 29.9 26.0 - 34.0 pg   MCHC 33.0 30.0 - 36.0 g/dL   RDW 18.8 41.6 - 60.6 %   Platelets 291 150 - 400 K/uL   nRBC 0.0 0.0 - 0.2 %   Neutrophils Relative % 59 %   Neutro Abs 4.0 1.7 - 7.7 K/uL   Lymphocytes Relative 31 %   Lymphs Abs 2.1 0.7 - 4.0 K/uL   Monocytes Relative 6 %   Monocytes Absolute 0.4 0.1 - 1.0 K/uL   Eosinophils Relative 4 %   Eosinophils Absolute 0.3 0.0 - 0.5 K/uL   Basophils Relative 0 %   Basophils Absolute 0.0 0.0 - 0.1 K/uL   Immature Granulocytes 0 %   Abs Immature Granulocytes 0.02 0.00 - 0.07 K/uL  Rapid HIV screen (HIV 1/2 Ab+Ag)     Status: None   Collection Time: 12/14/19 10:47 PM  Result Value Ref Range   HIV-1 P24 Antigen - HIV24 NON REACTIVE NON REACTIVE   HIV 1/2 Antibodies NON REACTIVE NON REACTIVE   Interpretation (HIV Ag Ab)      A non reactive test result means that HIV 1 or HIV 2 antibodies and HIV 1 p24 antigen were not detected in the specimen.  hCG, quantitative, pregnancy     Status: Abnormal   Collection Time: 12/14/19 10:47 PM  Result Value Ref Range   hCG, Beta Chain, Quant, S 2,224 (H) <5 mIU/mL    IMAGING US OB Comp Less 14 Wks  Result Date:  12/14/2019 CLINICAL DATA:  Pregnant patient in second trimester pregnancy with vaginal bleeding. Gestational age by last menstrual. Fourteen weeks 4 days. EXAM: OBSTETRIC <14 WK ULTRASOUND TECHNIQUE: Transabdominal ultrasound was performed for evaluation of the gestation as well as the maternal uterus and adnexal regions. COMPARISON:  None. FINDINGS: Intrauterine gestational sac: Single Yolk sac:  Not Visualized. Embryo:  Visualized. Cardiac Activity: Not Visualized. Heart Rate: 0 bpm CRL:   15.75 mm   7 w 6 d                  [place] EDC: 07/26/2020 Subchorionic hemorrhage:  Moderate. Maternal uterus/adnexae: Single intrauterine gestation with absent fetal heart tones. Left ovary is normal and demonstrates blood flow. Corpus luteal cyst in the right ovary which demonstrates blood flow. No adnexal mass or pelvic free fluid. IMPRESSION: Single intrauterine pregnancy with absent fetal heart tones. Findings consistent with fetal demise. Electronically Signed   By: Narda Rutherford M.D.   On: 12/14/2019 23:13    MAU Management/MDM: Ordered usual first trimester r/o ectopic labs.   Pelvic exam and cultures done Will check baseline Ultrasound to rule out ectopic.  This bleeding/pain can represent a normal pregnancy with bleeding, spontaneous abortion or even an ectopic which can be life-threatening.  The process as listed above helps to determine which of these is present.  Reviewed findings of fetal demise and Trichomonal vaginitis. Reviewed options including expectant management, Cytotec and D&C She prefers to proceed expectantly Flagyl 2gm given here  Rx for Expedited Partner Therapy given.  ASSESSMENT 1. Vaginal bleeding in pregnancy, second trimester   2. Vaginal bleeding in pregnancy, second trimester   Pregnancy at [redacted]w[redacted]d by  LMP Missed abortion Trichomonal vaginitis  PLAN Discharge home Rx Percocet #10tab given for use at home prn pain 2 Percocets given here for pain Pt will call [place]  [facility] tomorrow for followup Pt stable at time of discharge. Encouraged to return here or to other Urgent Care/ED if she develops worsening of symptoms, increase in pain, fever, or other concerning symptoms.    Wynelle Bourgeois CNM, MSN Certified Nurse-Midwife 12/14/2019  10:14 PM

## 2019-12-14 NOTE — ED Provider Notes (Signed)
MSE was initiated and I personally evaluated the patient and placed orders (if any) at  9:44 PM on December 14, 2019.  G6P5 lmp 09/03/2019, right lower abdominal cramping with spotting. No prenatal care. Appears uncomfortable, temp 100.2 (not aware of fever at home).  +upreg. Discussed with Hilda Lias, MAU APP, patient will be seen in MAU.  The patient appears stable so that the remainder of the MSE may be completed by another provider.   Jeannie Fend, PA-C 12/14/19 2145    Terald Sleeper, MD 12/15/19 [phone]

## 2019-12-15 DIAGNOSIS — Z3A14 14 weeks gestation of pregnancy: Secondary | ICD-10-CM | POA: Diagnosis not present

## 2019-12-15 DIAGNOSIS — O98311 Other infections with a predominantly sexual mode of transmission complicating pregnancy, first trimester: Secondary | ICD-10-CM | POA: Diagnosis not present

## 2019-12-15 DIAGNOSIS — A5901 Trichomonal vulvovaginitis: Secondary | ICD-10-CM | POA: Diagnosis not present

## 2019-12-15 DIAGNOSIS — O4692 Antepartum hemorrhage, unspecified, second trimester: Secondary | ICD-10-CM

## 2019-12-15 DIAGNOSIS — O021 Missed abortion: Secondary | ICD-10-CM

## 2019-12-15 DIAGNOSIS — O09522 Supervision of elderly multigravida, second trimester: Secondary | ICD-10-CM | POA: Diagnosis not present

## 2019-12-15 DIAGNOSIS — Z8632 Personal history of gestational diabetes: Secondary | ICD-10-CM | POA: Diagnosis not present

## 2019-12-15 LAB — GC/CHLAMYDIA PROBE AMP (~~LOC~~) NOT AT ARMC
Chlamydia: NEGATIVE
Comment: NEGATIVE
Comment: NORMAL
Neisseria Gonorrhea: NEGATIVE

## 2019-12-15 MED ORDER — OXYCODONE-ACETAMINOPHEN 5-325 MG PO TABS: 1.0000 | ORAL_TABLET | Freq: Four times a day (QID) | ORAL | 0 refills | Status: DC | PRN

## 2019-12-15 NOTE — Discharge Instructions (Signed)
Trichomoniasis Trichomoniasis is an STI (sexually transmitted infection) that can affect both women and men. In women, the outer area of the female genitalia (vulva) and the vagina are affected. In men, mainly the penis is affected, but the prostate and other reproductive organs can also be involved.  This condition can be treated with medicine. It often has no symptoms (is asymptomatic), especially in men. If not treated, trichomoniasis can last for months or years. What are the causes? This condition is caused by a parasite called Trichomonas vaginalis. Trichomoniasis most often spreads from person to person (is contagious) through sexual contact. What increases the risk? The following factors may make you more likely to develop this condition:  Having unprotected sex.  Having sex with a partner who has trichomoniasis.  Having multiple sexual partners.  Having had previous trichomoniasis infections or other STIs. What are the signs or symptoms? In women, symptoms of trichomoniasis include:  Abnormal vaginal discharge that is clear, white, gray, or yellow-green and foamy and has an unusual "fishy" odor.  Itching and irritation of the vagina and vulva.  Burning or pain during urination or sex.  Redness and swelling of the genitals. In men, symptoms of trichomoniasis include:  Penile discharge that may be foamy or contain pus.  Pain in the penis. This may happen only when urinating.  Itching or irritation inside the penis.  Burning after urination or ejaculation. How is this diagnosed? In women, this condition may be found during a routine Pap test or physical exam. It may be found in men during a routine physical exam. Your health care provider may do tests to help diagnose this infection, such as:  Urine tests (men and women).  The following in women: ? Testing the pH of the vagina. ? A vaginal swab test that checks for the Trichomonas vaginalis parasite. ? Testing vaginal  secretions. Your health care provider may test you for other STIs, including HIV (human immunodeficiency virus). How is this treated? This condition is treated with medicine taken by mouth (orally), such as metronidazole or tinidazole, to fight the infection. Your sexual partner(s) also need to be tested and treated.  If you are a woman and you plan to become pregnant or think you may be pregnant, tell your health care provider right away. Some medicines that are used to treat the infection should not be taken during pregnancy. Your health care provider may recommend over-the-counter medicines or creams to help relieve itching or irritation. You may be tested for infection again 3 months after treatment. Follow these instructions at home:  Take and use over-the-counter and prescription medicines, including creams, only as told by your health care provider.  Take your antibiotic medicine as told by your health care provider. Do not stop taking the antibiotic even if you start to feel better.  Do not have sex until 7-10 days after you finish your medicine, or until your health care provider approves. Ask your health care provider when you may start to have sex again.  (Women) Do not douche or wear tampons while you have the infection.  Discuss your infection with your sexual partner(s). Make sure that your partner gets tested and treated, if necessary.  Keep all follow-up visits as told by your health care provider. This is important. How is this prevented?   Use condoms every time you have sex. Using condoms correctly and consistently can help protect against STIs.  Avoid having multiple sexual partners.  Talk with your sexual partner about any   symptoms that either of you may have, as well as any history of STIs.  Get tested for STIs and STDs (sexually transmitted diseases) before you have sex. Ask your partner to do the same.  Do not have sexual contact if you have symptoms of  trichomoniasis or another STI. Contact a health care provider if:  You still have symptoms after you finish your medicine.  You develop pain in your abdomen.  You have pain when you urinate.  You have bleeding after sex.  You develop a rash.  You feel nauseous or you vomit.  You plan to become pregnant or think you may be pregnant. Summary  Trichomoniasis is an STI (sexually transmitted infection) that can affect both women and men.  This condition often has no symptoms (is asymptomatic), especially in men.  Without treatment, this condition can last for months or years.  You should not have sex until 7-10 days after you finish your medicine, or until your health care provider approves. Ask your health care provider when you may start to have sex again.  Discuss your infection with your sexual partner(s). Make sure that your partner gets tested and treated, if necessary. This information is not intended to replace advice given to you by your health care provider. Make sure you discuss any questions you have with your health care provider. Document Revised: 07/26/2018 Document Reviewed: 07/26/2018 Elsevier Patient Education  2020 [employer]. Incomplete Miscarriage A miscarriage is the loss of an unborn baby (fetus) before the 20th week of pregnancy. In an incomplete miscarriage, parts of the fetus or placenta (afterbirth) remain in the body. Most miscarriages happen in the first 3 months of pregnancy. Sometimes, it happens before a woman even knows she is pregnant. Having a miscarriage can be an emotional experience. If you have had a miscarriage, talk with your health care provider about any questions you may have about miscarrying, the grieving process, and your future pregnancy plans. What are the causes? This condition may be caused by:  Problems with the genes or chromosomes that make it impossible for the baby to develop normally. These problems are most often the result  of random errors that occur early in development, and are not passed from parent to child (not inherited).  Infection of the cervix or uterus.  Conditions that affect hormone balance in the body.  Problems with the cervix, such as the cervix opening and thinning before pregnancy is at term (cervical insufficiency).  Problems with the uterus, such as a uterus with an abnormal shape, fibroids in the uterus, or problems that were present from birth (congenital abnormalities).  Certain medical conditions.  Smoking, drinking alcohol, or using drugs.  Injury (trauma). Many times, the cause of a miscarriage is not known. What are the signs or symptoms? Symptoms of this condition include:  Vaginal bleeding or spotting, with or without cramps or pain.  Pain or cramping in the abdomen or lower back.  Passing fluid, tissue, or blood clots from the vagina. How is this diagnosed? This condition may be diagnosed based on:  A physical exam.  Ultrasound.  Blood tests.  Urine tests. How is this treated? An incomplete miscarriage may be treated with:  Dilation and curettage (D&C). This is a procedure in which the cervix is stretched open and the lining of the uterus (endometrium) is scraped to remove any remaining tissue from the pregnancy.  Medicines, such as: ? Antibiotic medicine to treat infection. ? Medicine to help any remaining tissue pass  out of your uterus. ? Medicine to reduce (contract) the size of the uterus. These medicines may be given if you have a lot of bleeding. If you have Rh negative blood and your baby was Rh positive, you will need a shot of medicine called Rh immunoglobulinto protect future babies from Rh blood problems. "Rh-negative" and "Rh-positive" refer to whether or not the blood has a specific protein found on the surface of red blood cells (Rh factor). Follow these instructions at home: Medicines   Take over-the-counter and prescription medicines only as  told by your health care provider.  If you were prescribed antibiotic medicine, take your antibiotic as told by your health care provider. Do not stop taking the antibiotic even if you start to feel better.  Do not take NSAIDs, such as aspirin and ibuprofen, unless approved by your doctor. These medicines can cause bleeding. Activity  Rest as directed. Ask your health care provider what activities are safe for you.  Have someone help with home and family responsibilities during this time. General instructions  Keep track of the number of sanitary pads you use each day and how soaked (saturated) they are. Write down this information.  Monitor the amount of tissue or blood clots that you pass from your vagina. Save any large amounts of tissue for your health care provider to examine.  Do not use tampons, douche, or have sex until your health care provider approves.  To help you and your partner with the process of grieving, talk with your health care provider or seek counseling to help cope with the pregnancy loss.  When you are ready, meet with your health care provider to discuss important steps you should take for your health, as well as steps to take in order to have a healthy pregnancy in the future.  Keep all follow-up visits as told by your health care provider. This is important. Where to find more information  The American Congress of Obstetricians and Gynecologists: www.acog.org  U.[place] Department of Health and Cytogeneticist of Women's Health: http://hoffman.com/ Contact a health care provider if:  You have a fever or chills.  You have a foul smelling vaginal discharge. Get help right away if:  You have severe cramps or pain in your back or abdomen.  You pass walnut-sized (or larger) blood clots or tissue from your vagina.  You have heavy bleeding, soaking more than 1 regular sanitary pad in an hour.  You become lightheaded or weak.  You pass out.  You have  feelings of sadness that take over your thoughts, or you have thoughts of hurting yourself. Summary  In an incomplete miscarriage, parts of the fetus or placenta (afterbirth) remain in the body.  There are multiple treatment options for an incomplete miscarriage, talk to your health care provider about the best option for you.  Follow your health care provider's instructions for follow-up care.  To help you and your partner with the process of grieving, talk with your health care provider or seek counseling to help cope with the pregnancy loss. This information is not intended to replace advice given to you by your health care provider. Make sure you discuss any questions you have with your health care provider. Document Revised: 11/18/2017 Document Reviewed: 11/18/2016 Elsevier Patient Education  2020 [employer].

## 2020-02-20 DIAGNOSIS — O021 Missed abortion: Secondary | ICD-10-CM | POA: Diagnosis not present

## 2020-03-01 DIAGNOSIS — H5213 Myopia, bilateral: Secondary | ICD-10-CM | POA: Diagnosis not present

## 2020-03-05 DIAGNOSIS — Z209 Contact with and (suspected) exposure to unspecified communicable disease: Secondary | ICD-10-CM | POA: Diagnosis not present

## 2020-03-05 DIAGNOSIS — N920 Excessive and frequent menstruation with regular cycle: Secondary | ICD-10-CM | POA: Diagnosis not present

## 2020-06-21 ENCOUNTER — Other Ambulatory Visit: Payer: Self-pay | Admitting: Sleep Medicine

## 2020-06-21 ENCOUNTER — Other Ambulatory Visit: Payer: Medicaid Other

## 2020-06-21 DIAGNOSIS — Z20822 Contact with and (suspected) exposure to covid-19: Secondary | ICD-10-CM

## 2020-06-22 LAB — NOVEL CORONAVIRUS, NAA: SARS-CoV-2, NAA: NOT DETECTED

## 2020-06-22 LAB — SARS-COV-2, NAA 2 DAY TAT

## 2020-07-12 ENCOUNTER — Other Ambulatory Visit: Payer: Self-pay

## 2020-07-12 ENCOUNTER — Other Ambulatory Visit: Payer: Medicaid Other

## 2020-07-12 DIAGNOSIS — Z20822 Contact with and (suspected) exposure to covid-19: Secondary | ICD-10-CM

## 2020-07-15 LAB — NOVEL CORONAVIRUS, NAA: SARS-CoV-2, NAA: NOT DETECTED

## 2020-10-26 NOTE — L&D Delivery Note (Addendum)
Delivery Note Pt felt better after epidural but begun to appreciate increased vaginal pressure. On exam she was noted to be completely dilated.  She proceeded to push. Contractions were initially q 6-8 mins. After about contractions were now q 2-3 mins . She pushed well and at 8:47 PM a viable female was delivered via Vaginal, Spontaneous (Presentation: Right Occiput Anterior).  APGAR: 7,8 ; weight pending .   Placenta status: Spontaneous;Pathology, schultz Intact.  Cord: 3vc  with the following complications: small abruption.  Cord pH: pending cytotec placed rectally and 1 dose of methergine given due to small trickle of blood noted. Fundus however firm   Anesthesia: Epidural Episiotomy: None Lacerations: None Suture Repair:  n/a Est. Blood Loss (mL):  pending ( approx )  Mom to postpartum.  Baby to Couplet care / Skin to Skin.  Cathrine Muster 04/09/2021, 9:09 PM

## 2020-11-19 LAB — OB RESULTS CONSOLE HIV ANTIBODY (ROUTINE TESTING): HIV: NONREACTIVE

## 2020-11-19 LAB — OB RESULTS CONSOLE RPR: RPR: NONREACTIVE

## 2020-11-19 LAB — OB RESULTS CONSOLE HEPATITIS B SURFACE ANTIGEN: Hepatitis B Surface Ag: NEGATIVE

## 2020-11-19 LAB — OB RESULTS CONSOLE GC/CHLAMYDIA
Chlamydia: NEGATIVE
Gonorrhea: NEGATIVE

## 2020-11-19 LAB — OB RESULTS CONSOLE RUBELLA ANTIBODY, IGM: Rubella: IMMUNE

## 2021-02-26 ENCOUNTER — Other Ambulatory Visit: Payer: Self-pay

## 2021-02-26 ENCOUNTER — Non-Acute Institutional Stay (HOSPITAL_COMMUNITY)
Admission: RE | Admit: 2021-02-26 | Discharge: 2021-02-26 | Disposition: A | Discharge status: Home / Self Care | Payer: Medicaid Other | Source: Ambulatory Visit | Attending: Internal Medicine | Admitting: Internal Medicine

## 2021-02-26 DIAGNOSIS — O99019 Anemia complicating pregnancy, unspecified trimester: Secondary | ICD-10-CM | POA: Diagnosis not present

## 2021-02-26 MED ORDER — SODIUM CHLORIDE 0.9 % IV SOLN
INTRAVENOUS | Status: DC | PRN
  Administered 2021-02-26: 250 mL via INTRAVENOUS

## 2021-02-26 MED ORDER — FERUMOXYTOL INJECTION 510 MG/17 ML
510.0000 mg | Freq: Once | INTRAVENOUS | Status: AC
  Administered 2021-02-26: 510 mg via INTRAVENOUS
  Filled 2021-02-26: qty 510

## 2021-02-26 NOTE — Progress Notes (Signed)
Patient received IV Feraheme (dose 1 of 2) as ordered by Ellison Hughs MD. Observed for at least 30 minutes post infusion. Tolerated well, vitals stable, discharge instructions given , verbalized understanding. Instructed pt to make next appointment for 1 week with scheduler prior to leaving, verbalized understanding. Patient alert, oriented, and ambulatory at the time of discharge.

## 2021-02-26 NOTE — Discharge Instructions (Signed)
Ferumoxytol injection What is this medicine? FERUMOXYTOL is an iron complex. Iron is used to make healthy red blood cells, which carry oxygen and nutrients throughout the body. This medicine is used to treat iron deficiency anemia. This medicine may be used for other purposes; ask your health care provider or pharmacist if you have questions. COMMON BRAND NAME(S): Feraheme What should I tell my health care provider before I take this medicine? They need to know if you have any of these conditions:  anemia not caused by low iron levels  high levels of iron in the blood  magnetic resonance imaging (MRI) test scheduled  an unusual or allergic reaction to iron, other medicines, foods, dyes, or preservatives  pregnant or trying to get pregnant  breast-feeding How should I use this medicine? This medicine is for injection into a vein. It is given by a health care professional in a hospital or clinic setting. Talk to your pediatrician regarding the use of this medicine in children. Special care may be needed. Overdosage: If you think you have taken too much of this medicine contact a poison control center or emergency room at once. NOTE: This medicine is only for you. Do not share this medicine with others. What if I miss a dose? It is important not to miss your dose. Call your doctor or health care professional if you are unable to keep an appointment. What may interact with this medicine? This medicine may interact with the following medications:  other iron products This list may not describe all possible interactions. Give your health care provider a list of all the medicines, herbs, non-prescription drugs, or dietary supplements you use. Also tell them if you smoke, drink alcohol, or use illegal drugs. Some items may interact with your medicine. What should I watch for while using this medicine? Visit your doctor or healthcare professional regularly. Tell your doctor or healthcare  professional if your symptoms do not start to get better or if they get worse. You may need blood work done while you are taking this medicine. You may need to follow a special diet. Talk to your doctor. Foods that contain iron include: whole grains/cereals, dried fruits, beans, or peas, leafy green vegetables, and organ meats (liver, kidney). What side effects may I notice from receiving this medicine? Side effects that you should report to your doctor or health care professional as soon as possible:  allergic reactions like skin rash, itching or hives, swelling of the face, lips, or tongue  breathing problems  changes in blood pressure  feeling faint or lightheaded, falls  fever or chills  flushing, sweating, or hot feelings  swelling of the ankles or feet Side effects that usually do not require medical attention (report to your doctor or health care professional if they continue or are bothersome):  diarrhea  headache  nausea, vomiting  stomach pain This list may not describe all possible side effects. Call your doctor for medical advice about side effects. You may report side effects to FDA at 1-800-FDA-1088. Where should I keep my medicine? This drug is given in a hospital or clinic and will not be stored at home. NOTE: This sheet is a summary. It may not cover all possible information. If you have questions about this medicine, talk to your doctor, pharmacist, or health care provider.  2021 Elsevier/Gold Standard (2016-11-30 20:21:10)  

## 2021-03-05 ENCOUNTER — Other Ambulatory Visit: Payer: Self-pay

## 2021-03-05 ENCOUNTER — Non-Acute Institutional Stay (HOSPITAL_COMMUNITY)
Admission: RE | Admit: 2021-03-05 | Discharge: 2021-03-05 | Disposition: A | Discharge status: Home / Self Care | Payer: Medicaid Other | Source: Ambulatory Visit | Attending: Internal Medicine | Admitting: Internal Medicine

## 2021-03-05 DIAGNOSIS — O99019 Anemia complicating pregnancy, unspecified trimester: Secondary | ICD-10-CM | POA: Diagnosis not present

## 2021-03-05 MED ORDER — SODIUM CHLORIDE 0.9 % IV SOLN
INTRAVENOUS | Status: DC | PRN
  Administered 2021-03-05: 250 mL via INTRAVENOUS

## 2021-03-05 MED ORDER — SODIUM CHLORIDE 0.9 % IV SOLN
510.0000 mg | Freq: Once | INTRAVENOUS | Status: AC
  Administered 2021-03-05: 510 mg via INTRAVENOUS
  Filled 2021-03-05: qty 510

## 2021-03-05 NOTE — Progress Notes (Signed)
Patient received IV Feraheme (dose 2 of 2) as ordered by Ellison Hughs MD. Observed for at least 30 minutes post infusion. Tolerated well, vitals stable, pt declined AVS , verbalized understanding. Patient alert, oriented, and ambulatory at the time of discharge.

## 2021-03-27 LAB — OB RESULTS CONSOLE GBS: GBS: NEGATIVE

## 2021-03-31 ENCOUNTER — Inpatient Hospital Stay (HOSPITAL_COMMUNITY)
Admission: AD | Admit: 2021-03-31 | Discharge: 2021-04-01 | Disposition: A | Discharge status: Home / Self Care | Payer: Medicaid Other | Attending: Obstetrics and Gynecology | Admitting: Obstetrics and Gynecology

## 2021-03-31 ENCOUNTER — Other Ambulatory Visit: Payer: Self-pay

## 2021-03-31 DIAGNOSIS — O471 False labor at or after 37 completed weeks of gestation: Secondary | ICD-10-CM | POA: Insufficient documentation

## 2021-03-31 DIAGNOSIS — Z3689 Encounter for other specified antenatal screening: Secondary | ICD-10-CM | POA: Insufficient documentation

## 2021-03-31 DIAGNOSIS — Z3A38 38 weeks gestation of pregnancy: Secondary | ICD-10-CM | POA: Insufficient documentation

## 2021-04-01 ENCOUNTER — Encounter (HOSPITAL_COMMUNITY): Payer: Self-pay | Admitting: *Deleted

## 2021-04-01 DIAGNOSIS — Z3A00 Weeks of gestation of pregnancy not specified: Secondary | ICD-10-CM

## 2021-04-01 DIAGNOSIS — Z3689 Encounter for other specified antenatal screening: Secondary | ICD-10-CM

## 2021-04-01 DIAGNOSIS — Z3A38 38 weeks gestation of pregnancy: Secondary | ICD-10-CM | POA: Diagnosis not present

## 2021-04-01 DIAGNOSIS — O471 False labor at or after 37 completed weeks of gestation: Secondary | ICD-10-CM | POA: Diagnosis present

## 2021-04-01 NOTE — MAU Provider Note (Signed)
Patient was assessed for active labor and managed by nursing staff during this encounter. I have reviewed the chart and agree with the documentation and plan. I have also reviewed the NST for appropriate reactivity.  Fetal Tracing: reactive Baseline: 125 Variability: moderate Accelerations: 15x15  Decelerations: none Toco: q4-6   Patient stable for discharge home with labor precautions.  Edd Arbour, CNM, MSN, IBCLC Certified Nurse Midwife, [facility]

## 2021-04-01 NOTE — Discharge Instructions (Signed)

## 2021-04-01 NOTE — MAU Note (Signed)
PT SAYS UC STRONG SINCE 10PM. PNC- DR BANGA- VE 1 CM. DENIES HSV  GBS- UNSURE

## 2021-04-09 ENCOUNTER — Inpatient Hospital Stay (HOSPITAL_COMMUNITY): Payer: Medicaid Other | Admitting: Anesthesiology

## 2021-04-09 ENCOUNTER — Inpatient Hospital Stay (HOSPITAL_COMMUNITY): Payer: Medicaid Other

## 2021-04-09 ENCOUNTER — Other Ambulatory Visit: Payer: Self-pay

## 2021-04-09 ENCOUNTER — Inpatient Hospital Stay (HOSPITAL_COMMUNITY)
Admission: AD | Admit: 2021-04-09 | Discharge: 2021-04-11 | DRG: 807 | Disposition: A | Discharge status: Home / Self Care | Payer: Medicaid Other | Attending: Obstetrics and Gynecology | Admitting: Obstetrics and Gynecology

## 2021-04-09 ENCOUNTER — Encounter (HOSPITAL_COMMUNITY): Payer: Self-pay | Admitting: Obstetrics and Gynecology

## 2021-04-09 DIAGNOSIS — Z20822 Contact with and (suspected) exposure to covid-19: Secondary | ICD-10-CM | POA: Diagnosis present

## 2021-04-09 DIAGNOSIS — O34219 Maternal care for unspecified type scar from previous cesarean delivery: Secondary | ICD-10-CM | POA: Diagnosis present

## 2021-04-09 DIAGNOSIS — Z3A39 39 weeks gestation of pregnancy: Secondary | ICD-10-CM

## 2021-04-09 DIAGNOSIS — O26893 Other specified pregnancy related conditions, third trimester: Secondary | ICD-10-CM | POA: Diagnosis present

## 2021-04-09 DIAGNOSIS — O9902 Anemia complicating childbirth: Secondary | ICD-10-CM | POA: Diagnosis present

## 2021-04-09 DIAGNOSIS — O4593 Premature separation of placenta, unspecified, third trimester: Secondary | ICD-10-CM | POA: Diagnosis present

## 2021-04-09 LAB — RESP PANEL BY RT-PCR (FLU A&B, COVID) ARPGX2
Influenza A by PCR: NEGATIVE
Influenza B by PCR: NEGATIVE
SARS Coronavirus 2 by RT PCR: NEGATIVE

## 2021-04-09 LAB — TYPE AND SCREEN
ABO/RH(D): O POS
Antibody Screen: NEGATIVE

## 2021-04-09 LAB — CBC
HCT: 33.7 % — ABNORMAL LOW (ref 36.0–46.0)
Hemoglobin: 10.9 g/dL — ABNORMAL LOW (ref 12.0–15.0)
MCH: 27.7 pg (ref 26.0–34.0)
MCHC: 32.3 g/dL (ref 30.0–36.0)
MCV: 85.5 fL (ref 80.0–100.0)
Platelets: 224 10*3/uL (ref 150–400)
RBC: 3.94 MIL/uL (ref 3.87–5.11)
RDW: 22.5 % — ABNORMAL HIGH (ref 11.5–15.5)
WBC: 6.3 10*3/uL (ref 4.0–10.5)
nRBC: 0 % (ref 0.0–0.2)

## 2021-04-09 MED ORDER — ACETAMINOPHEN 325 MG PO TABS: 650.0000 mg | ORAL_TABLET | ORAL | Status: DC | PRN

## 2021-04-09 MED ORDER — METHYLERGONOVINE MALEATE 0.2 MG/ML IJ SOLN
0.2000 mg | Freq: Once | INTRAMUSCULAR | Status: AC
  Administered 2021-04-09: 0.2 mg via INTRAMUSCULAR

## 2021-04-09 MED ORDER — METHYLERGONOVINE MALEATE 0.2 MG/ML IJ SOLN
INTRAMUSCULAR | Status: AC
  Filled 2021-04-09: qty 1

## 2021-04-09 MED ORDER — LACTATED RINGERS IV SOLN: 500.0000 mL | Freq: Once | INTRAVENOUS | Status: DC

## 2021-04-09 MED ORDER — MISOPROSTOL 200 MCG PO TABS
ORAL_TABLET | ORAL | Status: AC
  Filled 2021-04-09: qty 4

## 2021-04-09 MED ORDER — OXYCODONE-ACETAMINOPHEN 5-325 MG PO TABS: 1.0000 | ORAL_TABLET | ORAL | Status: DC | PRN

## 2021-04-09 MED ORDER — EPHEDRINE 5 MG/ML INJ: 10.0000 mg | INTRAVENOUS | Status: DC | PRN

## 2021-04-09 MED ORDER — LIDOCAINE HCL (PF) 1 % IJ SOLN: 30.0000 mL | INTRAMUSCULAR | Status: DC | PRN

## 2021-04-09 MED ORDER — DIPHENHYDRAMINE HCL 50 MG/ML IJ SOLN: 12.5000 mg | INTRAMUSCULAR | Status: DC | PRN

## 2021-04-09 MED ORDER — MISOPROSTOL 200 MCG PO TABS
800.0000 ug | ORAL_TABLET | Freq: Once | ORAL | Status: AC
  Administered 2021-04-09: 800 ug via RECTAL

## 2021-04-09 MED ORDER — TERBUTALINE SULFATE 1 MG/ML IJ SOLN: 0.2500 mg | Freq: Once | INTRAMUSCULAR | Status: DC | PRN

## 2021-04-09 MED ORDER — OXYTOCIN-SODIUM CHLORIDE 30-0.9 UT/500ML-% IV SOLN
1.0000 m[IU]/min | INTRAVENOUS | Status: DC
  Administered 2021-04-09: 2 m[IU]/min via INTRAVENOUS
  Filled 2021-04-09: qty 500

## 2021-04-09 MED ORDER — PHENYLEPHRINE 40 MCG/ML (10ML) SYRINGE FOR IV PUSH (FOR BLOOD PRESSURE SUPPORT): 80.0000 ug | PREFILLED_SYRINGE | INTRAVENOUS | Status: DC | PRN

## 2021-04-09 MED ORDER — OXYCODONE-ACETAMINOPHEN 5-325 MG PO TABS: 2.0000 | ORAL_TABLET | ORAL | Status: DC | PRN

## 2021-04-09 MED ORDER — TRANEXAMIC ACID-NACL 1000-0.7 MG/100ML-% IV SOLN
INTRAVENOUS | Status: AC
  Filled 2021-04-09: qty 100

## 2021-04-09 MED ORDER — LACTATED RINGERS IV SOLN
500.0000 mL | INTRAVENOUS | Status: DC | PRN
Start: 2021-04-09 — End: 2021-04-09

## 2021-04-09 MED ORDER — LACTATED RINGERS IV SOLN: INTRAVENOUS | Status: DC

## 2021-04-09 MED ORDER — LIDOCAINE HCL (PF) 1 % IJ SOLN
INTRAMUSCULAR | Status: DC | PRN
  Administered 2021-04-09 (×2): 4 mL via EPIDURAL

## 2021-04-09 MED ORDER — OXYTOCIN BOLUS FROM INFUSION
333.0000 mL | Freq: Once | INTRAVENOUS | Status: AC
  Administered 2021-04-09: 333 mL via INTRAVENOUS

## 2021-04-09 MED ORDER — BUTORPHANOL TARTRATE 1 MG/ML IJ SOLN
1.0000 mg | INTRAMUSCULAR | Status: DC | PRN
Start: 2021-04-09 — End: 2021-04-09

## 2021-04-09 MED ORDER — FENTANYL-BUPIVACAINE-NACL 0.5-0.125-0.9 MG/250ML-% EP SOLN
12.0000 mL/h | EPIDURAL | Status: DC | PRN
  Administered 2021-04-09: 11 mL/h via EPIDURAL
  Filled 2021-04-09: qty 250

## 2021-04-09 MED ORDER — ONDANSETRON HCL 4 MG/2ML IJ SOLN: 4.0000 mg | Freq: Four times a day (QID) | INTRAMUSCULAR | Status: DC | PRN

## 2021-04-09 MED ORDER — SOD CITRATE-CITRIC ACID 500-334 MG/5ML PO SOLN: 30.0000 mL | ORAL | Status: DC | PRN

## 2021-04-09 MED ORDER — OXYTOCIN-SODIUM CHLORIDE 30-0.9 UT/500ML-% IV SOLN
2.5000 [IU]/h | INTRAVENOUS | Status: DC
  Administered 2021-04-09 (×2): 2.5 [IU]/h via INTRAVENOUS
  Filled 2021-04-09: qty 500

## 2021-04-09 NOTE — H&P (Signed)
Carla Marquez is a 72 [id number] female presenting for IOL at 2 0/[redacted]wks gestation. (TOLAC). Pt is dated per LMP which was confirmed by a 16week [place]. Her pregnancy has been complicated by AMA, anemia requiring fereheme ( 10.6 at 37 wks), C/S x 2 and successful VBAC x 3, , history of GDM and late to care ( 16 weeks) She is GBS neg.  She declined genetic and chromosomal testing. She has been on baby aspirin daily  OB History     Gravida  9   Para  5   Term  5   Preterm      AB  3   Living  5      SAB  1   IAB  2   Ectopic      Multiple  0   Live Births  5          Past Medical History:  Diagnosis Date   AMA (advanced maternal age) multigravida 35+    Anemia    Asthma    Childhood   Hx of gonorrhea    SVD (spontaneous vaginal delivery) 06/04/2018   Vaginal birth after cesarean (VBAC) (3/27) 01/20/2015   Past Surgical History:  Procedure Laterality Date   CESAREAN SECTION     x2   Family History: family history includes Diabetes in her maternal grandmother and paternal grandmother; Heart attack (age of onset: 71) in her mother; Heart disease in her maternal grandmother and mother. Social History:  reports that she has never smoked. She has never used smokeless tobacco. She reports that she does not drink alcohol and does not use drugs.     Maternal Diabetes: No Genetic Screening: Declined Maternal Ultrasounds/Referrals: Normal Fetal Ultrasounds or other Referrals:  None Maternal Substance Abuse:  No Significant Maternal Medications:  None Significant Maternal Lab Results:  Group B Strep negative Other Comments:  None  Review of Systems History   unknown if currently breastfeeding. Exam Physical Exam  Prenatal labs: ABO, Rh:   Antibody:   Rubella:   RPR:    HBsAg:    HIV:    GBS:     Assessment/Plan: 72IW [id number] female at 48 0/7wks with history of VBAC x 3 after c/s x 2 here for IOL - Admit - AROM/Low dose pitocin  - Pain control prn -  GBS neg - Covid neg - Anticipate successful VBAC   Janean Sark Gracyn Santillanes 04/09/2021, 1:52 PM

## 2021-04-09 NOTE — Progress Notes (Signed)
Patient ID: Carla Marquez, female   DOB: August 07, 1981, 40 y.o.   MRN: [id number] Late entry   I saw pt about 1-2 hours ago after I was called and informed she had passed a large clot and blood - measured approx when attempted to use the bathroom Pt reported no change in symptoms - no lightheadedness or dizziness Pt was still appreciating contractions   GEN - mild discomfort with contractions EFM - 135, rare late and variable decels, + accels, cat 2 TOCO - contractions q 1-59mins SVE - 5/70/-2; moderate clot noted at os  A/P: Pt progressing with vaginal bleeding noted         I verified that per prior US no previa noted.          Plan to continue to monitor labor progress however I counseled pt on parameters for proceeding to delivery via cesarean section: signs of fetal distress remote from delivery or persistent vaginal bleeding remote from delivery.           Pitocin off at this time           Pt will consider epidural for pain control

## 2021-04-09 NOTE — Anesthesia Procedure Notes (Signed)
Epidural Patient location during procedure: OB Start time: 04/09/2021 7:58 PM End time: 04/09/2021 8:07 PM  Staffing Anesthesiologist: Mal Amabile, MD Performed: anesthesiologist   Preanesthetic Checklist Completed: patient identified, IV checked, site marked, risks and benefits discussed, surgical consent, monitors and equipment checked, pre-op evaluation and timeout performed  Epidural Patient position: sitting Prep: DuraPrep and site prepped and draped Patient monitoring: continuous pulse ox and blood pressure Approach: midline Location: L3-L4 Injection technique: LOR air  Needle:  Needle type: Tuohy  Needle gauge: 17 G Needle length: 9 cm and 9 Needle insertion depth: 6 cm Catheter type: closed end flexible Catheter size: 19 Gauge Catheter at skin depth: 11 cm Test dose: negative and Other  Assessment Events: blood not aspirated, injection not painful, no injection resistance, no paresthesia and negative IV test  Additional Notes Patient identified. Risks and benefits discussed including failed block, incomplete  Pain control, post dural puncture headache, nerve damage, paralysis, blood pressure Changes, nausea, vomiting, reactions to medications-both toxic and allergic and post Partum back pain. All questions were answered. Patient expressed understanding and wished to proceed. Sterile technique was used throughout procedure. Epidural site was Dressed with sterile barrier dressing. No paresthesias, signs of intravascular injection Or signs of intrathecal spread were encountered.  Patient was more comfortable after the epidural was dosed. Please see RN's note for documentation of vital signs and FHR which are stable. Reason for block:procedure for pain

## 2021-04-09 NOTE — Anesthesia Preprocedure Evaluation (Signed)
Anesthesia Evaluation  Patient identified by MRN, date of birth, ID band Patient awake    Reviewed: Allergy & Precautions, Patient's Chart, lab work & pertinent test results  Airway Mallampati: I  TM Distance: >3 FB Neck ROM: Full    Dental no notable dental hx. (+) Teeth Intact   Pulmonary asthma , former smoker,    Pulmonary exam normal breath sounds clear to auscultation       Cardiovascular negative cardio ROS Normal cardiovascular exam Rhythm:Regular Rate:Normal     Neuro/Psych PSYCHIATRIC DISORDERS Anxiety negative neurological ROS     GI/Hepatic Neg liver ROS, GERD  ,  Endo/Other  Obesity  Renal/GU negative Renal ROS  negative genitourinary   Musculoskeletal negative musculoskeletal ROS (+)   Abdominal (+) + obese,   Peds  Hematology  (+) anemia ,   Anesthesia Other Findings   Reproductive/Obstetrics (+) Pregnancy Previous C/Section x 2 Successful VBAC x 3 AMA                             Anesthesia Physical Anesthesia Plan  ASA: 2  Anesthesia Plan: Epidural   Post-op Pain Management:    Induction:   PONV Risk Score and Plan:   Airway Management Planned: Natural Airway  Additional Equipment:   Intra-op Plan:   Post-operative Plan:   Informed Consent: I have reviewed the patients History and Physical, chart, labs and discussed the procedure including the risks, benefits and alternatives for the proposed anesthesia with the patient or authorized representative who has indicated his/her understanding and acceptance.       Plan Discussed with: Anesthesiologist  Anesthesia Plan Comments:         Anesthesia Quick Evaluation

## 2021-04-09 NOTE — Progress Notes (Signed)
Patient ID: Carla Marquez, female   DOB: February 20, 1981, 40 y.o.   MRN: [id number] Pt reports contractions rated at 8/10 for pain She is trying to delivery with no pain medication - aware of options available: nitrous gas, IV pain medication and epidural  VSS GEN - mod discomfort with contractions EFM - 130, cat 1 TOCO - ctxs q 4-83mins SVE - 4/60/-2, clot at os   A/P: [id number] progressing in labor on of pitocin and after AROM          - hx c/s x 2 with VBAC x 3 to follow          - Pain control prn         - IUPC placed         - Max pitocin at          - Anticipate VBAC

## 2021-04-10 ENCOUNTER — Encounter (HOSPITAL_COMMUNITY): Payer: Self-pay | Admitting: Obstetrics and Gynecology

## 2021-04-10 LAB — CBC
HCT: 29.2 % — ABNORMAL LOW (ref 36.0–46.0)
Hemoglobin: 9.6 g/dL — ABNORMAL LOW (ref 12.0–15.0)
MCH: 27.8 pg (ref 26.0–34.0)
MCHC: 32.9 g/dL (ref 30.0–36.0)
MCV: 84.6 fL (ref 80.0–100.0)
Platelets: 206 10*3/uL (ref 150–400)
RBC: 3.45 MIL/uL — ABNORMAL LOW (ref 3.87–5.11)
RDW: 22.4 % — ABNORMAL HIGH (ref 11.5–15.5)
WBC: 10.7 10*3/uL — ABNORMAL HIGH (ref 4.0–10.5)
nRBC: 0 % (ref 0.0–0.2)

## 2021-04-10 LAB — RPR: RPR Ser Ql: NONREACTIVE

## 2021-04-10 MED ORDER — BENZOCAINE-MENTHOL 20-0.5 % EX AERO
1.0000 "application " | INHALATION_SPRAY | CUTANEOUS | Status: DC | PRN
  Administered 2021-04-10: 1 via TOPICAL
  Filled 2021-04-10: qty 56

## 2021-04-10 MED ORDER — COCONUT OIL OIL
1.0000 "application " | TOPICAL_OIL | Status: DC | PRN
  Administered 2021-04-10: 1 via TOPICAL

## 2021-04-10 MED ORDER — SIMETHICONE 80 MG PO CHEW: 80.0000 mg | CHEWABLE_TABLET | ORAL | Status: DC | PRN

## 2021-04-10 MED ORDER — WITCH HAZEL-GLYCERIN EX PADS: 1.0000 "application " | MEDICATED_PAD | CUTANEOUS | Status: DC | PRN

## 2021-04-10 MED ORDER — OXYCODONE HCL 5 MG PO TABS: 5.0000 mg | ORAL_TABLET | ORAL | Status: DC | PRN

## 2021-04-10 MED ORDER — ONDANSETRON HCL 4 MG PO TABS: 4.0000 mg | ORAL_TABLET | ORAL | Status: DC | PRN

## 2021-04-10 MED ORDER — OXYTOCIN-SODIUM CHLORIDE 30-0.9 UT/500ML-% IV SOLN: 2.5000 [IU]/h | INTRAVENOUS | Status: DC | PRN

## 2021-04-10 MED ORDER — PRENATAL MULTIVITAMIN CH
1.0000 | ORAL_TABLET | Freq: Every day | ORAL | Status: DC
  Administered 2021-04-10 – 2021-04-11 (×2): 1 via ORAL
  Filled 2021-04-10 (×2): qty 1

## 2021-04-10 MED ORDER — ONDANSETRON HCL 4 MG/2ML IJ SOLN: 4.0000 mg | INTRAMUSCULAR | Status: DC | PRN

## 2021-04-10 MED ORDER — ACETAMINOPHEN 325 MG PO TABS
650.0000 mg | ORAL_TABLET | ORAL | Status: DC | PRN
  Administered 2021-04-10 (×2): 650 mg via ORAL
  Filled 2021-04-10 (×2): qty 2

## 2021-04-10 MED ORDER — TETANUS-DIPHTH-ACELL PERTUSSIS 5-2.5-18.5 LF-MCG/0.5 IM SUSY: 0.5000 mL | PREFILLED_SYRINGE | Freq: Once | INTRAMUSCULAR | Status: DC

## 2021-04-10 MED ORDER — ZOLPIDEM TARTRATE 5 MG PO TABS: 5.0000 mg | ORAL_TABLET | Freq: Every evening | ORAL | Status: DC | PRN

## 2021-04-10 MED ORDER — DIPHENHYDRAMINE HCL 25 MG PO CAPS: 25.0000 mg | ORAL_CAPSULE | Freq: Four times a day (QID) | ORAL | Status: DC | PRN

## 2021-04-10 MED ORDER — IBUPROFEN 600 MG PO TABS
600.0000 mg | ORAL_TABLET | Freq: Four times a day (QID) | ORAL | Status: DC
  Administered 2021-04-10 – 2021-04-11 (×7): 600 mg via ORAL
  Filled 2021-04-10 (×7): qty 1

## 2021-04-10 MED ORDER — FERROUS SULFATE 325 (65 FE) MG PO TABS
325.0000 mg | ORAL_TABLET | Freq: Two times a day (BID) | ORAL | Status: DC
  Administered 2021-04-10 – 2021-04-11 (×3): 325 mg via ORAL
  Filled 2021-04-10 (×3): qty 1

## 2021-04-10 MED ORDER — LACTATED RINGERS IV SOLN: INTRAVENOUS | Status: DC

## 2021-04-10 MED ORDER — OXYCODONE HCL 5 MG PO TABS: 10.0000 mg | ORAL_TABLET | ORAL | Status: DC | PRN

## 2021-04-10 MED ORDER — SENNOSIDES-DOCUSATE SODIUM 8.6-50 MG PO TABS
2.0000 | ORAL_TABLET | Freq: Every day | ORAL | Status: DC
  Administered 2021-04-10 – 2021-04-11 (×2): 2 via ORAL
  Filled 2021-04-10 (×2): qty 2

## 2021-04-10 MED ORDER — DIBUCAINE (PERIANAL) 1 % EX OINT: 1.0000 "application " | TOPICAL_OINTMENT | CUTANEOUS | Status: DC | PRN

## 2021-04-10 NOTE — Progress Notes (Signed)
POSTPARTUM PROGRESS NOTE  Post Partum Day #1  Subjective:  No acute events overnight.  Pt denies problems with ambulating, voiding or po intake.  She denies nausea or vomiting.  Pain is well controlled.    Lochia Minimal, significantly improved from overnight. No dizziness or lightheadedness with ambulating this AM, eating/drinking well  Objective: Blood pressure 112/72, pulse 72, temperature 98.2 F (36.8 C), temperature source Oral, resp. rate 16, height 5\' 2"  (1.575 m), weight 77.7 kg, SpO2 99 %, unknown if currently breastfeeding.  Physical Exam:  General: alert, cooperative and no distress Lochia:normal flow Chest: CTAB Heart: RRR no m/r/g Abdomen: +BS, soft, nontender Uterine Fundus: firm, 2cm below umbilicus Extremities: neg edema, neg calf TTP BL, neg Homans BL  Recent Labs    04/09/21 1410 04/10/21 0528  HGB 10.9* 9.6*  HCT 33.7* 29.2*    Assessment/Plan:  ASSESSMENT: Carla Marquez is a 40 y.o. 20 s/p VBAC @ [redacted]w[redacted]d. PNC c/b AMA, anemia requiring feraheme, CSX x2. Delivery notable for partial abruption (PR cytotec and IM methergine admin)  Plan for discharge tomorrow and Breastfeeding   LOS: 1 day

## 2021-04-10 NOTE — Anesthesia Postprocedure Evaluation (Signed)
Anesthesia Post Note  Patient: Carla Marquez  Procedure(s) Performed: AN AD HOC LABOR EPIDURAL     Patient location during evaluation: Mother Baby Anesthesia Type: Epidural Level of consciousness: awake, awake and alert and oriented Pain management: pain level controlled Vital Signs Assessment: post-procedure vital signs reviewed and stable Respiratory status: spontaneous breathing, nonlabored ventilation and respiratory function stable Cardiovascular status: blood pressure returned to baseline Postop Assessment: no headache, epidural receding, patient able to bend at knees, adequate PO intake, no backache, no apparent nausea or vomiting and able to ambulate Anesthetic complications: no   No notable events documented.  Last Vitals:  Vitals:   04/10/21 0415 04/10/21 0900  BP: 112/72 103/71  Pulse: 72 65  Resp: 16 18  Temp: 36.8 C 37 C  SpO2: 99% 100%    Last Pain:  Vitals:   04/10/21 0902  TempSrc:   PainSc: 7    Pain Goal:                Epidural/Spinal Function Cutaneous sensation: Normal sensation (04/10/21 0902), Patient able to flex knees: Yes (04/10/21 0902), Patient able to lift hips off bed: Yes (04/10/21 0902), Back pain beyond tenderness at insertion site: No (04/10/21 0902), Progressively worsening motor and/or sensory loss: No (04/10/21 0902), Bowel and/or bladder incontinence post epidural: No (04/10/21 0902)  [name]

## 2021-04-10 NOTE — Lactation Note (Signed)
This note was copied from a baby's chart. Lactation Consultation Note  Patient Name: Carla Marquez [id number] Date: 04/10/2021 Reason for consult: Follow-up assessment;Mother's request;Difficult latch;Term Age:39 hours  Infant struggling to latch given Mom has large nipples. Infant prone with Mom laid back best position to keep infant from popping off.   Infant showing signs of milk transfer doing latch with breast compression. But she comes off the breast after 10-15 minutes feeding still looking to latch.   Infant thick labial attachment, high palate and able to extend tongue pass the gum line.  Mom large nobular nipples with some compression but no other signs of trauma.   Plan 1. To feed based on cues 8-12x in 24 hr period no more than 3 hrs without an attempt. Mom to offer both breasts with compression and look for signs of milk transfer.           2. Mom provided with yellow slow flow nipple and review paced bottle feeding based on LPTI guidelines given infant just over 6 lbs. DBM consent completed. Mom to [facility] EBM first followed by [facility].         3 Mom to pump with DEBP q 3 hrs for 15 minutes          All questions answered at the end of the feeding.   Maternal Data Has patient been taught Hand Expression?: Yes  Feeding Mother's Current Feeding Choice: Breast Milk  LATCH Score Latch: Repeated attempts needed to sustain latch, nipple held in mouth throughout feeding, stimulation needed to elicit sucking reflex.  Audible Swallowing: A few with stimulation  Type of Nipple: Everted at rest and after stimulation (Nipples are large infant mouth small, prone best position with Mom laid back.)  Comfort (Breast/Nipple): Soft / non-tender  Hold (Positioning): Assistance needed to correctly position infant at breast and maintain latch.  LATCH Score: 7   Lactation Tools Discussed/Used Tools: Pump;Flanges Flange Size: 30 Breast pump type: Double-Electric Breast Pump Pump  Education: Setup, frequency, and cleaning;Milk Storage Reason for Pumping: increase stimulation Pumping frequency: every 3 hrs for  Interventions Interventions: Breast feeding basics reviewed;Breast compression;Assisted with latch;Adjust position;Skin to skin;DEBP;Breast massage;Position options;Hand express;Expressed milk;Education  Discharge Pump: Personal WIC Program: Yes  Consult Status Consult Status: Follow-up Date: 04/11/21 Follow-up type: In-patient    Dois Juarbe  Carla Marquez 04/10/2021, 6:46 PM

## 2021-04-10 NOTE — Lactation Note (Signed)
This note was copied from a baby's chart. Lactation Consultation Note  Patient Name: Carla Marquez [id number] Date: 04/10/2021 Reason for consult: Initial assessment;Term (Mom has supernumerary or accessory nipples on above the left and right breast.) Age:40 hours P6, term female infant. Per mom, she made few attempts and this be infant's first time latching at the breast. LC did suck training with infant, infant was tongue thrusting at first on LC's gloved finger but after few attempts infant started suckling in coordinated patten.  LC ask mom to hand express a small amount of colostrum out prior to latching infant at the breast, mom has large nipples the diameter or size of quarter , infant can open mouth wide, mom latched infant on her left breast using the football hold position. Infant breastfeed on and off the breast for 10 minutes. Afterwards mom hand expressed 6 mls of colostrum that was spoon feed to infant. Mom knows if infant doesn't latch, to hand express and give infant back EBM by spoon within first 24 hours. Mom will continue to work on infant latching at the breast, mom knows to call RN or [facility] for latch assistance if needed. Mom will breastfeed infant according to hunger cues, 8 to 12+ times or more within 24 hours, STS. LC discussed infant's input and output with parents. Mom made aware of O/P services, breastfeeding support groups, community resources, and our phone # for post-discharge questions.   Maternal Data Has patient been taught Hand Expression?: Yes Does the patient have breastfeeding experience prior to this delivery?: Yes How long did the patient breastfeed?: Per mom, 3rd child BF for 2 months, 4th for 1 year, 5th child who is 32 years of age for 6 months.  Feeding Mother's Current Feeding Choice: Breast Milk  LATCH Score Latch: Repeated attempts needed to sustain latch, nipple held in mouth throughout feeding, stimulation needed to elicit sucking reflex. (Mom  has large nipples about the diameter of quater, but infant can latch at the breast, towards the end of the feeding infant was starting to sustain latch.)  Audible Swallowing: A few with stimulation  Type of Nipple: Everted at rest and after stimulation  Comfort (Breast/Nipple): Soft / non-tender  Hold (Positioning): Assistance needed to correctly position infant at breast and maintain latch.  LATCH Score: 7   Lactation Tools Discussed/Used    Interventions Interventions: Breast feeding basics reviewed;Assisted with latch;Skin to skin;Hand express;Breast compression;Adjust position;Support pillows;Position options;Expressed milk;Education  Discharge Pump: Personal (Per mom, she has DEBP at home.) [facility] Program: Yes  Consult Status Consult Status: Follow-up Date: 04/10/21 Follow-up type: In-patient    Danelle Earthly 04/10/2021, 2:15 AM

## 2021-04-11 LAB — SURGICAL PATHOLOGY

## 2021-04-11 MED ORDER — IBUPROFEN 600 MG PO TABS: 600.0000 mg | ORAL_TABLET | Freq: Four times a day (QID) | ORAL | 0 refills | Status: AC

## 2021-04-11 NOTE — Lactation Note (Signed)
This note was copied from a baby's chart. Lactation Consultation Note  Patient Name: Carla Marquez [id number] Date: 04/11/2021   Age:39 hours  Mom is eating breakfast & infant is sleeping in bassinet. Mom shared that infant did a better job with nursing over night (e.g. opening mouth wider), but is sleepy during the day. Mom is agreeable to having me return to see how infant does at the breast. Mom will call out for me when she is ready for me to return.    Lurline Hare [facility] 04/11/2021, 8:19 AM

## 2021-04-11 NOTE — Lactation Note (Signed)
This note was copied from a baby's chart. Lactation Consultation Note  Patient Name: Girl Petina Muraski [id number] Date: 04/11/2021   Age:39 hours  Mom called out for latch check. Mom woke infant to nurse. Infant was able to latch & accommodate the entire size of the nipple, but infant seemed sleepy and did not readily suckle after latching. Mom reports that she can hear when infant swallows at the breast and that she could hear swallows "pretty often" overnight. I suggested that Mom give infant a little bit more time and then call for [place] later.  Mom says she has a hands-free pump at home and she had made sure her flanges will accommodate the size of her nipples.   Mom is able to hand express & a good amount of colostrum was noted.    Lurline Hare [facility] 04/11/2021, 9:08 AM

## 2021-04-11 NOTE — Progress Notes (Signed)
Post Partum Day 2 Subjective: no complaints, up ad lib, and voiding  Objective: Blood pressure 103/69, pulse 80, temperature 98.4 F (36.9 C), temperature source Oral, resp. rate 16, height 5\' 2"  (1.575 m), weight 77.7 kg, SpO2 100 %, unknown if currently breastfeeding.  Physical Exam:  General: alert and cooperative Lochia: appropriate Uterine Fundus: firm   Recent Labs    04/09/21 1410 04/10/21 0528  HGB 10.9* 9.6*  HCT 33.7* 29.2*    Assessment/Plan: Discharge home Take oral iron OTC. May want interval tubal.  Will discuss at postpartum visit   LOS: 2 days   04/12/21 04/11/2021, 8:19 AM

## 2021-04-11 NOTE — Lactation Note (Signed)
This note was copied from a baby's chart. Lactation Consultation Note  Patient Name: Carla Marquez OIZTI'W Date: 04/11/2021 Reason for consult: Mother's request;Term Age:39 hours  I was called into patient's room, infant had already been on the breast for about 15 min. Infant was suckling, but I noted a prolonged suck:swallow ratio at that moment; however, it was likely because infant was ending her feeding. Mom reported having been able to hear the swallows previously during this feeding.   Infant is able to accommodate the size of Mom's nipple with latching. Mom's plan, if there are concerns about infant's ability at the breast, is to hand express or pump for supplementing purposes. I also brought in a small amount of DBM in case needed prior to her leaving the hospital.  Mom reports that, typically, her milk comes to volume quickly.     Lurline Hare [facility] 04/11/2021, 11:42 AM

## 2021-04-11 NOTE — Discharge Summary (Signed)
Postpartum Discharge Summary       Patient Name: Carla Marquez DOB: [birth year] MRN: [id number]  Date of admission: 04/09/2021 Delivery date:04/09/2021  Delivering provider: Edwinna Areola  Date of discharge: 04/11/2021  Admitting diagnosis: Previous cesarean delivery affecting pregnancy [O34.219] Intrauterine pregnancy: [redacted]w[redacted]d     Secondary diagnosis:  Active Problems:   Previous cesarean delivery affecting pregnancy   SVD (spontaneous vaginal delivery) VBAC delivery  Additional problems: chronic anemia    Discharge diagnosis: Term Pregnancy Delivered and VBAC                                              Post partum procedures: none Augmentation: AROM and Pitocin Complications: Placental Abruption (small)  Hospital course: Induction of Labor With Vaginal Delivery   40 y.o. yo [id number] at [redacted]w[redacted]d was admitted to the hospital 04/09/2021 for induction of labor.  Indication for induction: Favorable cervix at term.  Patient had an uncomplicated labor course as follows: Membrane Rupture Time/Date: 2:20 PM ,04/09/2021   Delivery Method:VBAC, Spontaneous  Episiotomy: None  Lacerations:  None  Details of delivery can be found in separate delivery note.  Patient had a routine postpartum course. Patient is discharged home 04/11/21.  Newborn Data: Birth date:04/09/2021  Birth time:8:47 PM  Gender:Female  Living status:Living  Apgars:4 ,8  Weight:2886 g   Magnesium Sulfate received: No BMZ received: No Rhophylac:No  Physical exam  Vitals:   04/10/21 0900 04/10/21 1515 04/10/21 2145 04/11/21 0545  BP: 103/71 97/76 (!) 99/58 103/69  Pulse: 65 72 85 80  Resp: 18 18 17 16   Temp: 98.6 F (37 C) 98 F (36.7 C) 98.2 F (36.8 C) 98.4 F (36.9 C)  TempSrc: Oral Oral Oral Oral  SpO2: 100% 100% 100% 100%  Weight:      Height:       General: alert and cooperative Lochia: appropriate Uterine Fundus: firm  Labs: Lab Results  Component Value Date   WBC 10.7 (H)  04/10/2021   HGB 9.6 (L) 04/10/2021   HCT 29.2 (L) 04/10/2021   MCV 84.6 04/10/2021   PLT 206 04/10/2021   No flowsheet data found. Edinburgh Score: Edinburgh Postnatal Depression Scale Screening Tool 04/09/2021  I have been able to laugh and see the funny side of things. 0  I have looked forward with enjoyment to things. 0  I have blamed myself unnecessarily when things went wrong. 2  I have been anxious or worried for no good reason. 1  I have felt scared or panicky for no good reason. 0  Things have been getting on top of me. 0  I have been so unhappy that I have had difficulty sleeping. 1  I have felt sad or miserable. 1  I have been so unhappy that I have been crying. 1  The thought of harming myself has occurred to me. 0  Edinburgh Postnatal Depression Scale Total 6     After visit meds:  Allergies as of 04/11/2021   No Known Allergies      Medication List     TAKE these medications    acetaminophen 325 MG tablet Commonly known as: Tylenol Take 2 tablets (650 mg total) by mouth every 4 (four) hours as needed (for pain scale < 4).   ibuprofen 600 MG tablet Commonly known as: ADVIL Take 1 tablet (600 mg total) by  mouth every 6 (six) hours.   prenatal multivitamin Tabs tablet Take 1 tablet by mouth daily at 12 noon.         Discharge home in stable condition Infant Feeding: Breast Infant Disposition:home with mother Discharge instruction: per After Visit Summary and Postpartum booklet. Activity: Advance as tolerated. Pelvic rest for 6 weeks.  Diet: routine diet Future Appointments:No future appointments. Follow up Visit:  Follow-up Information     Edwinna Areola, DO. Schedule an appointment as soon as possible for a visit in 6 week(s).   Specialty: Obstetrics and Gynecology Why: postpartum Contact information: [address] [place] [address] [phone]                  Please schedule this patient for a In person  postpartum visit in 6 weeks with the following provider: MD.  Delivery mode:  VBAC, Spontaneous  Anticipated Birth Control:   Considering interval tubal sterilization   04/11/2021 Oliver Pila, MD

## 2021-04-16 ENCOUNTER — Inpatient Hospital Stay (HOSPITAL_COMMUNITY): Admit: 2021-04-16 | Payer: Self-pay

## 2021-09-08 ENCOUNTER — Emergency Department (HOSPITAL_COMMUNITY)
Admission: EM | Admit: 2021-09-08 | Discharge: 2021-09-08 | Disposition: A | Discharge status: Home / Self Care | Payer: Medicaid Other | Attending: Emergency Medicine | Admitting: Emergency Medicine

## 2021-09-08 DIAGNOSIS — N939 Abnormal uterine and vaginal bleeding, unspecified: Secondary | ICD-10-CM | POA: Diagnosis present

## 2021-09-08 DIAGNOSIS — N9489 Other specified conditions associated with female genital organs and menstrual cycle: Secondary | ICD-10-CM | POA: Diagnosis not present

## 2021-09-08 DIAGNOSIS — J45909 Unspecified asthma, uncomplicated: Secondary | ICD-10-CM | POA: Diagnosis not present

## 2021-09-08 DIAGNOSIS — D508 Other iron deficiency anemias: Secondary | ICD-10-CM | POA: Diagnosis not present

## 2021-09-08 DIAGNOSIS — D500 Iron deficiency anemia secondary to blood loss (chronic): Secondary | ICD-10-CM

## 2021-09-08 DIAGNOSIS — Z87891 Personal history of nicotine dependence: Secondary | ICD-10-CM | POA: Insufficient documentation

## 2021-09-08 LAB — CBC WITH DIFFERENTIAL/PLATELET
Abs Immature Granulocytes: 0.02 10*3/uL (ref 0.00–0.07)
Basophils Absolute: 0 10*3/uL (ref 0.0–0.1)
Basophils Relative: 1 %
Eosinophils Absolute: 0.1 10*3/uL (ref 0.0–0.5)
Eosinophils Relative: 2 %
HCT: 37.3 % (ref 36.0–46.0)
Hemoglobin: 11.5 g/dL — ABNORMAL LOW (ref 12.0–15.0)
Immature Granulocytes: 0 %
Lymphocytes Relative: 31 %
Lymphs Abs: 2 10*3/uL (ref 0.7–4.0)
MCH: 29.1 pg (ref 26.0–34.0)
MCHC: 30.8 g/dL (ref 30.0–36.0)
MCV: 94.4 fL (ref 80.0–100.0)
Monocytes Absolute: 0.4 10*3/uL (ref 0.1–1.0)
Monocytes Relative: 7 %
Neutro Abs: 3.7 10*3/uL (ref 1.7–7.7)
Neutrophils Relative %: 59 %
Platelets: 296 10*3/uL (ref 150–400)
RBC: 3.95 MIL/uL (ref 3.87–5.11)
RDW: 14.7 % (ref 11.5–15.5)
WBC: 6.2 10*3/uL (ref 4.0–10.5)
nRBC: 0 % (ref 0.0–0.2)

## 2021-09-08 LAB — URINALYSIS, ROUTINE W REFLEX MICROSCOPIC

## 2021-09-08 LAB — COMPREHENSIVE METABOLIC PANEL
ALT: 14 U/L (ref 0–44)
AST: 15 U/L (ref 15–41)
Albumin: 4 g/dL (ref 3.5–5.0)
Alkaline Phosphatase: 84 U/L (ref 38–126)
Anion gap: 7 (ref 5–15)
BUN: 10 mg/dL (ref 6–20)
CO2: 24 mmol/L (ref 22–32)
Calcium: 9.3 mg/dL (ref 8.9–10.3)
Chloride: 107 mmol/L (ref 98–111)
Creatinine, Ser: 0.68 mg/dL (ref 0.44–1.00)
GFR, Estimated: >60 mL/min (ref >60)
Glucose, Bld: 87 mg/dL (ref 70–99)
Potassium: 3.9 mmol/L (ref 3.5–5.1)
Sodium: 138 mmol/L (ref 135–145)
Total Bilirubin: 1 mg/dL (ref 0.3–1.2)
Total Protein: 7.6 g/dL (ref 6.5–8.1)

## 2021-09-08 LAB — URINALYSIS, MICROSCOPIC (REFLEX)
RBC / HPF: >50 RBC/hpf (ref 0–5)
Squamous Epithelial / HPF: NONE SEEN (ref 0–5)

## 2021-09-08 LAB — I-STAT BETA HCG BLOOD, ED (MC, WL, AP ONLY): I-stat hCG, quantitative: <5 m[IU]/mL (ref <5)

## 2021-09-08 MED ORDER — FERROUS SULFATE 325 (65 FE) MG PO TABS: 325.0000 mg | ORAL_TABLET | Freq: Every day | ORAL | 0 refills | Status: AC

## 2021-09-08 NOTE — ED Provider Notes (Signed)
[facility] EMERGENCY DEPARTMENT Provider Note   CSN: [id number] Arrival date & time: 09/08/21  0957     History Chief Complaint  Patient presents with   Vaginal Bleeding   Abdominal Pain    Carla Marquez is a 40 y.o. female resents to the emergency department complaining of vaginal bleeding.  Patient is approximately 5 months postpartum.  She reports this is the second menstrual cycle since birth.  She is not currently taking an oral contraceptive or utilizing any method of birth control but also reports she is not sexually active.  Reports the bleeding started yesterday and was associated with mild lower abdominal cramping and low back pain consistent with previous menstrual cycles.  Patient reports this menstrual cycle seems heavier than previous postpartum menstrual cycles.  She reports utilizing an entire pack of sanitary napkins yesterday.  States that she has seen some intermittent clotting.  Denies lightheadedness, nausea, vomiting, syncope or near syncope.  Denies shortness of breath.  Denies aggravating or alleviating factors.  No treatments prior to arrival.  Patient does have a history of anemia.  The history is provided by the patient and medical records. No language interpreter was used.      Past Medical History:  Diagnosis Date   AMA (advanced maternal age) multigravida 35+    Anemia    Asthma    Childhood   Hx of gonorrhea    SVD (spontaneous vaginal delivery) 06/04/2018   Vaginal birth after cesarean (VBAC) (3/27) 01/20/2015    Patient Active Problem List   Diagnosis Date Noted   Previous cesarean delivery affecting pregnancy 04/09/2021   SVD (spontaneous vaginal delivery) 04/09/2021   History of cryosurgery 12/14/2019   Situational anxiety 04/02/2017    Past Surgical History:  Procedure Laterality Date   CESAREAN SECTION     x2     OB History     Gravida  9   Para  6   Term  6   Preterm      AB  3   Living  6       SAB  1   IAB  2   Ectopic      Multiple  0   Live Births  6           Family History  Problem Relation Age of Onset   Heart disease Mother    Heart attack Mother 64   Diabetes Maternal Grandmother    Heart disease Maternal Grandmother    Diabetes Paternal Grandmother     Social History   Tobacco Use   Smoking status: Former    Types: Cigarettes    Quit date: 2019    Years since quitting: 3.8   Smokeless tobacco: Never  Vaping Use   Vaping Use: Never used  Substance Use Topics   Alcohol use: No   Drug use: No    Home Medications Prior to Admission medications   Medication Sig Start Date End Date Taking? Authorizing Provider  ferrous sulfate 325 (65 FE) MG tablet Take 1 tablet (325 mg total) by mouth daily. 09/08/21  Yes Shasha Buchbinder, Dahlia Client, PA-C  acetaminophen (TYLENOL) 325 MG tablet Take 2 tablets (650 mg total) by mouth every 4 (four) hours as needed (for pain scale < 4). Patient not taking: Reported on 09/08/2021 06/06/18   Huel Cote, MD  ibuprofen (ADVIL) 600 MG tablet Take 1 tablet (600 mg total) by mouth every 6 (six) hours. Patient not taking: Reported on 09/08/2021 04/11/21  Paula Compton, MD    Allergies    Patient has no known allergies.  Review of Systems   Review of Systems  Constitutional:  Negative for appetite change, diaphoresis, fatigue, fever and unexpected weight change.  HENT:  Negative for mouth sores.   Eyes:  Negative for visual disturbance.  Respiratory:  Negative for cough, chest tightness, shortness of breath and wheezing.   Cardiovascular:  Negative for chest pain.  Gastrointestinal:  Positive for abdominal pain. Negative for constipation, diarrhea, nausea and vomiting.  Endocrine: Negative for polydipsia, polyphagia and polyuria.  Genitourinary:  Positive for vaginal bleeding. Negative for dysuria, frequency, hematuria and urgency.  Musculoskeletal:  Positive for back pain. Negative for neck stiffness.  Skin:   Negative for rash.  Allergic/Immunologic: Negative for immunocompromised state.  Neurological:  Negative for syncope, light-headedness and headaches.  Hematological:  Does not bruise/bleed easily.  Psychiatric/Behavioral:  Negative for sleep disturbance. The patient is not nervous/anxious.    Physical Exam Updated Vital Signs BP 129/78   Pulse 77   Temp 99 F (37.2 C)   Resp 16   LMP 09/06/2021   SpO2 100%   Physical Exam Vitals and nursing note reviewed. Exam conducted with a chaperone present.  Constitutional:      General: She is not in acute distress.    Appearance: She is well-developed. She is not ill-appearing.  HENT:     Head: Normocephalic.  Eyes:     General: No scleral icterus.    Conjunctiva/sclera: Conjunctivae normal.  Cardiovascular:     Rate and Rhythm: Normal rate.     Pulses:          Radial pulses are 2+ on the right side and 2+ on the left side.  Pulmonary:     Effort: Pulmonary effort is normal.  Abdominal:     General: There is no distension.     Palpations: Abdomen is soft.     Tenderness: There is no abdominal tenderness.     Hernia: There is no hernia in the left inguinal area or right inguinal area.  Genitourinary:    General: Normal vulva.     Exam position: Supine.     Labia:        Right: No rash or lesion.        Left: No rash or lesion.      Vagina: Bleeding present. No vaginal discharge, tenderness or lesions.     Cervix: Cervical bleeding present. No discharge, friability, lesion or erythema.     Comments: Moderate amount of blood in the vaginal vault.  Cervix is closed with small amount of bleeding. Musculoskeletal:        General: Normal range of motion.     Cervical back: Normal range of motion.  Lymphadenopathy:     Lower Body: No right inguinal adenopathy. No left inguinal adenopathy.  Skin:    General: Skin is warm and dry.  Neurological:     Mental Status: She is alert.  Psychiatric:        Mood and Affect: Mood normal.     ED Results / Procedures / Treatments   Labs (all labs ordered are listed, but only abnormal results are displayed) Labs Reviewed  CBC WITH DIFFERENTIAL/PLATELET - Abnormal; Notable for the following components:      Result Value   Hemoglobin 11.5 (*)    All other components within normal limits  URINALYSIS, ROUTINE W REFLEX MICROSCOPIC - Abnormal; Notable for the following components:   Color, Urine  RED (*)    APPearance TURBID (*)    Glucose, UA   (*)    Value: TEST NOT REPORTED DUE TO COLOR INTERFERENCE OF URINE PIGMENT   Hgb urine dipstick   (*)    Value: TEST NOT REPORTED DUE TO COLOR INTERFERENCE OF URINE PIGMENT   Bilirubin Urine   (*)    Value: TEST NOT REPORTED DUE TO COLOR INTERFERENCE OF URINE PIGMENT   Ketones, ur   (*)    Value: TEST NOT REPORTED DUE TO COLOR INTERFERENCE OF URINE PIGMENT   Protein, ur   (*)    Value: TEST NOT REPORTED DUE TO COLOR INTERFERENCE OF URINE PIGMENT   Nitrite   (*)    Value: TEST NOT REPORTED DUE TO COLOR INTERFERENCE OF URINE PIGMENT   Leukocytes,Ua   (*)    Value: TEST NOT REPORTED DUE TO COLOR INTERFERENCE OF URINE PIGMENT   All other components within normal limits  URINALYSIS, MICROSCOPIC (REFLEX) - Abnormal; Notable for the following components:   Bacteria, UA RARE (*)    All other components within normal limits  COMPREHENSIVE METABOLIC PANEL  I-STAT BETA HCG BLOOD, ED ([facility], [facility], [facility] ONLY)     Procedures Procedures   Medications Ordered in ED Medications - No data to display  ED Course  I have reviewed the triage vital signs and the nursing notes.  Pertinent labs & imaging results that were available during my care of the patient were reviewed by me and considered in my medical decision making (see chart for details).    MDM Rules/Calculators/A&P                           Patient presents to the emergency department with vaginal bleeding.  On exam moderate menstrual bleeding is noted.  Pregnancy test is negative.   Unable to utilize UA due to large amount of blood however patient denies all urinary symptoms including dysuria, hematuria, frequency and urgency.  Lab work shows mild anemia with a hemoglobin of 11.5.  Patient discharged after birth with a hemoglobin of 9.6.  She has previously taken iron supplements and we will start her on them again today.   No tachycardia or shortness of breath to suggest symptomatic anemia.  Will have patient follow closely with OB/GYN.   Final Clinical Impression(s) / ED Diagnoses Final diagnoses:  Vaginal bleeding  Iron deficiency anemia due to chronic blood loss    Rx / DC Orders ED Discharge Orders          Ordered    ferrous sulfate 325 (65 FE) MG tablet  Daily        09/08/21 1709             Sulay Brymer, Gwenlyn Perking 09/08/21 1710    Wyvonnia Dusky, MD 09/08/21 [phone]

## 2021-09-08 NOTE — ED Notes (Signed)
Pt ambulated to room. No apparent distress.

## 2021-09-08 NOTE — ED Provider Notes (Signed)
Emergency Medicine Provider Triage Evaluation Note  Carla Marquez , a 40 y.o. female  was evaluated in triage.  Pt complains of vaginal bleeding since yesterday. This is her second menstrual cycle since she delivered a child vaginally in June.  She states that her last menstrual cycle was very heavy as well.  She states that yesterday she went through an entire pack of 20 menstrual pads  Review of Systems  Positive: Abd cramping" Vaginal bleeding Negative: Cough  Physical Exam  BP 123/71   Pulse 62   Temp 99.1 F (37.3 C) (Oral)   Resp 14   LMP 09/06/2021   SpO2 100%  Gen:   Awake, no distress   Resp:  Normal effort  MSK:   Moves extremities without difficulty  Other:    Medical Decision Making  Medically screening exam initiated at 11:03 AM.  Appropriate orders placed.  PETRA DUMLER was informed that the remainder of the evaluation will be completed by another provider, this initial triage assessment does not replace that evaluation, and the importance of remaining in the ED until their evaluation is complete.  Well-appearing on physical exam.  Vital signs are within normal limits.  We will check basic labs.   Gailen Shelter, [place] 09/08/21 1107    Milagros Loll, MD 09/09/21 [id number]

## 2021-09-08 NOTE — ED Triage Notes (Signed)
Pt. Stated, Carla Marquez been having a lot of bleeding since my child was born. Im feeling weak because of the problem

## 2021-09-08 NOTE — Discharge Instructions (Addendum)
1. Medications: Iron supplement -take with stool softener like Colace to help prevent constipation, usual home medications 2. Treatment: rest, drink plenty of fluids,  3. Follow Up: Please followup with your GYN in 5-7 days for discussion of your diagnoses and further evaluation after today's visit; if you do not have a primary care doctor use the resource guide provided to find one; Please return to the ER for lightheadedness, near-syncope, syncope, shortness of breath, chest pain or other concerns.

## 2021-09-19 IMAGING — US US OB COMP LESS 14 WK
1 series · 15 of 28 positions shown · non-contrast
Comparison: None.

CLINICAL DATA: Pregnant patient in second trimester pregnancy with
vaginal bleeding. Gestational age by last menstrual. Fourteen weeks
4 days.

EXAM:
OBSTETRIC <14 WK ULTRASOUND
TECHNIQUE: Transabdominal ultrasound was performed for evaluation of the
gestation as well as the maternal uterus and adnexal regions.

[Series 1: us ob comp less 14 wk · 32 acquisitions, 15 frames shown]
[im 1/32]
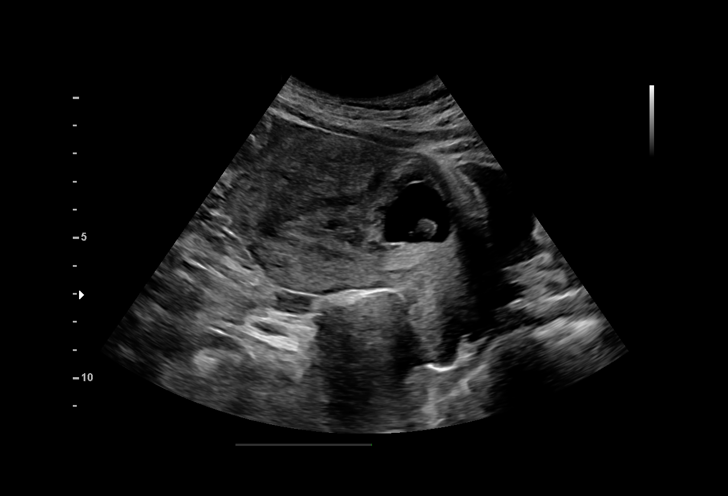
[im 3/32]
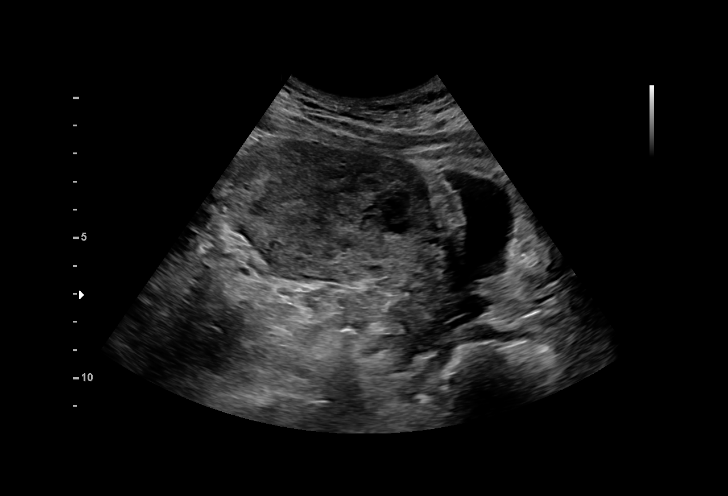
[im 5/32]
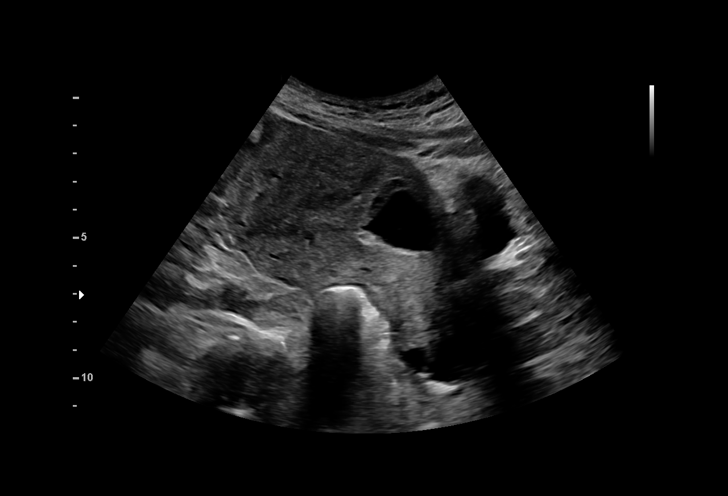
[im 7/32]
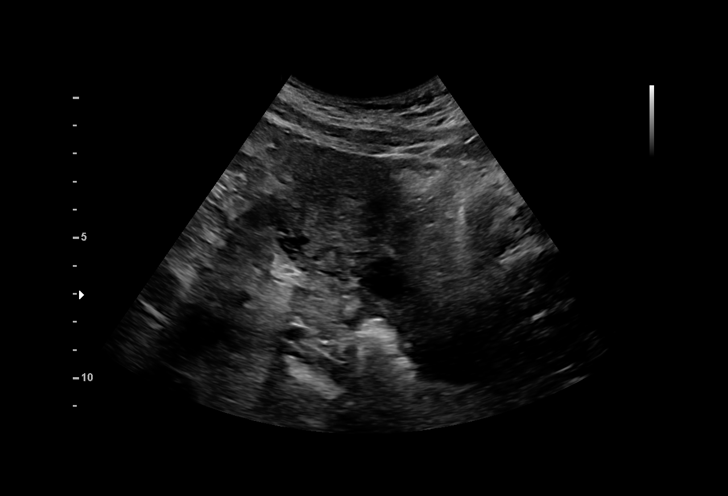
[im 10/32]
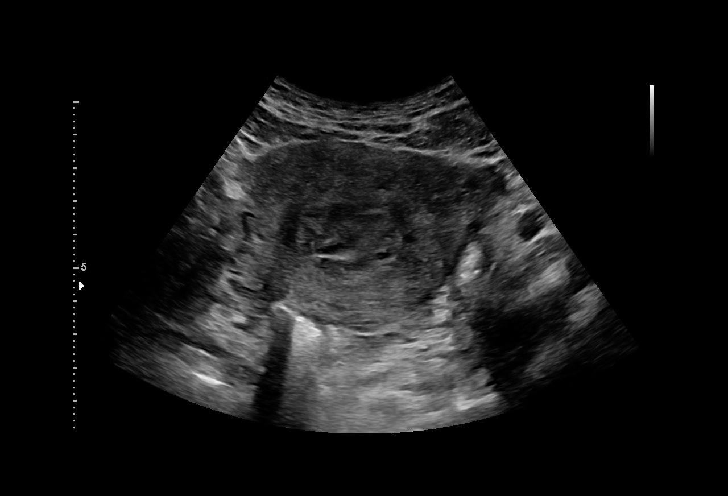
[im 12/32]
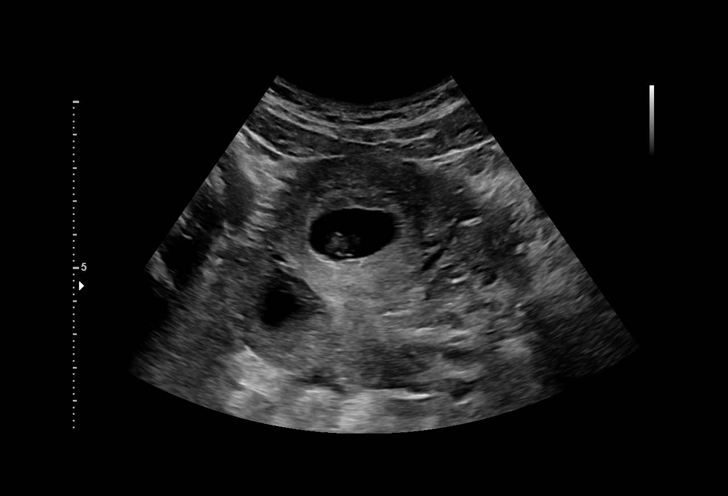
[im 14/32]
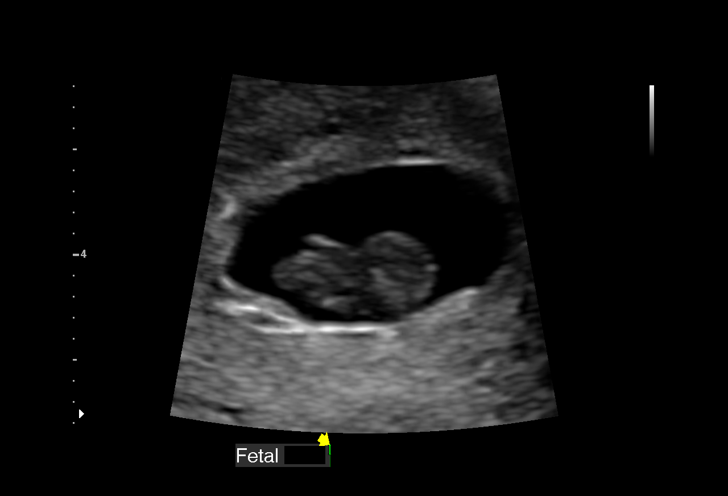
[im 17/32]
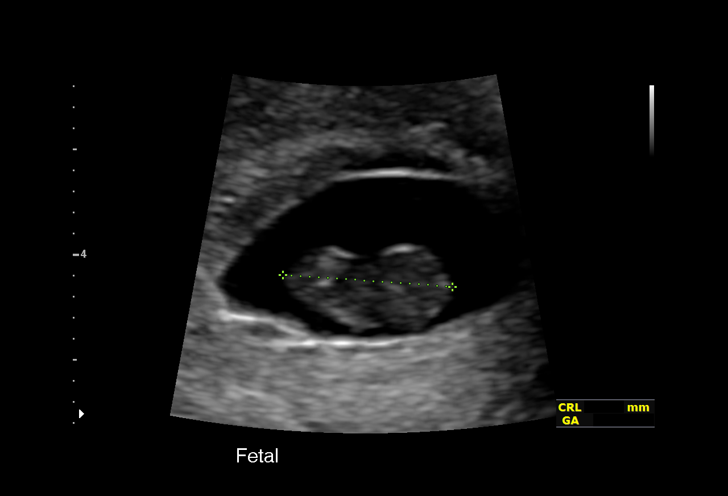
[im 18/32]
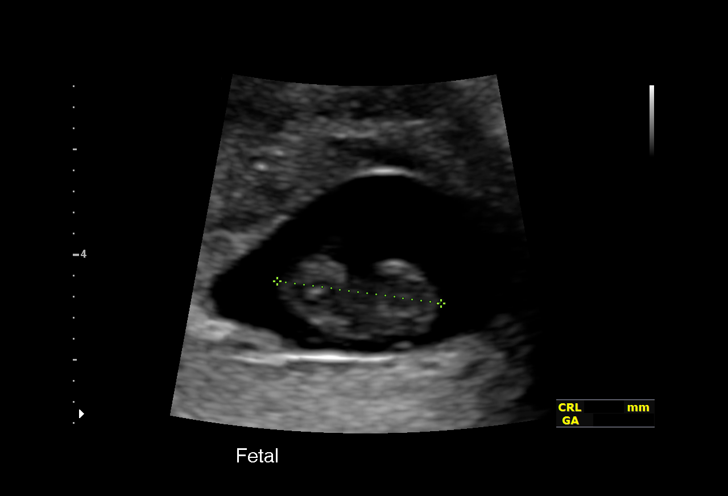
[im 20/32]
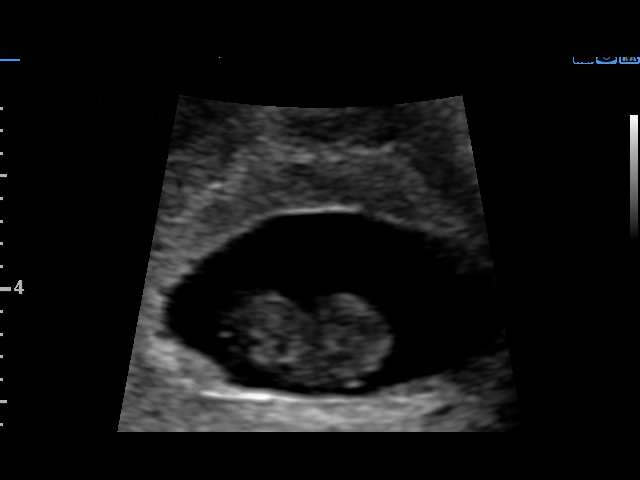
[im 22/32]
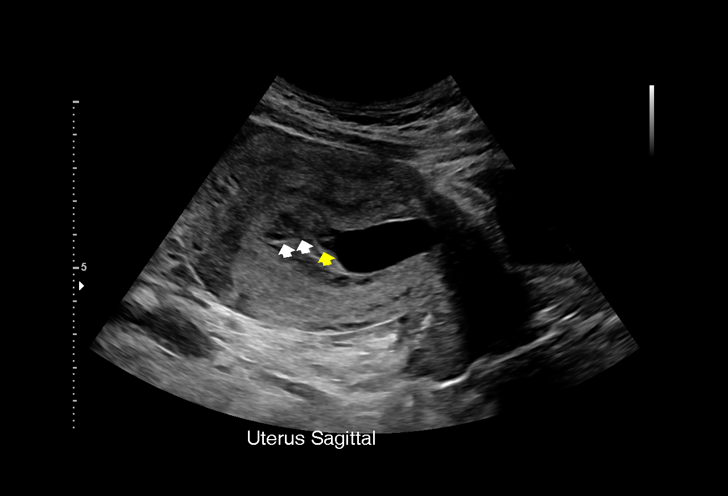
[im 25/32]
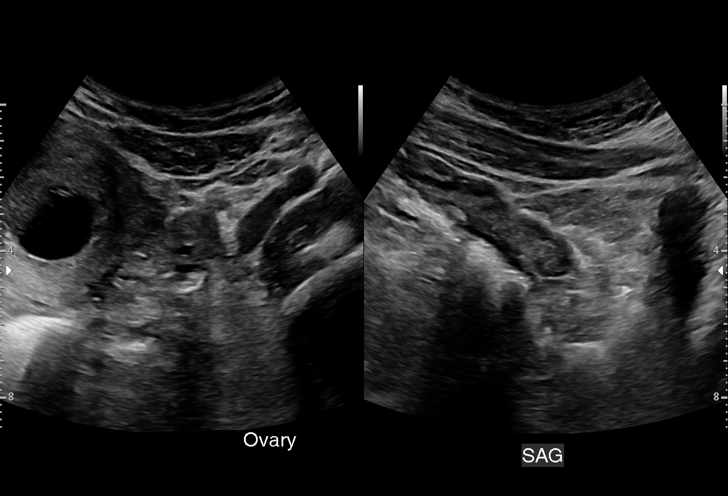
[im 27/32]
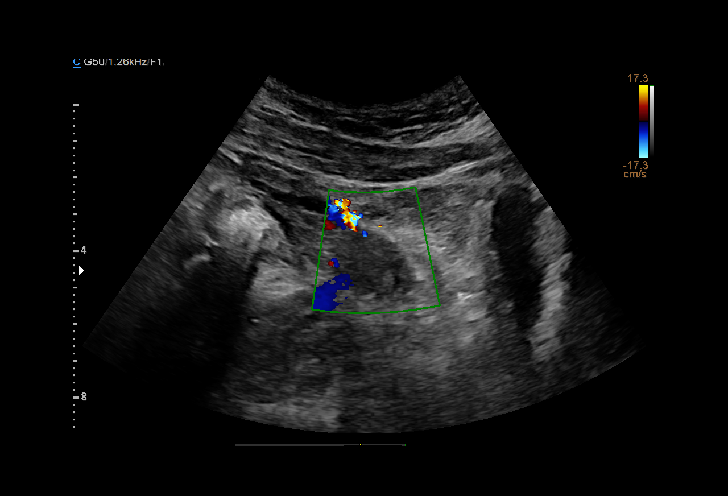
[im 29/32]
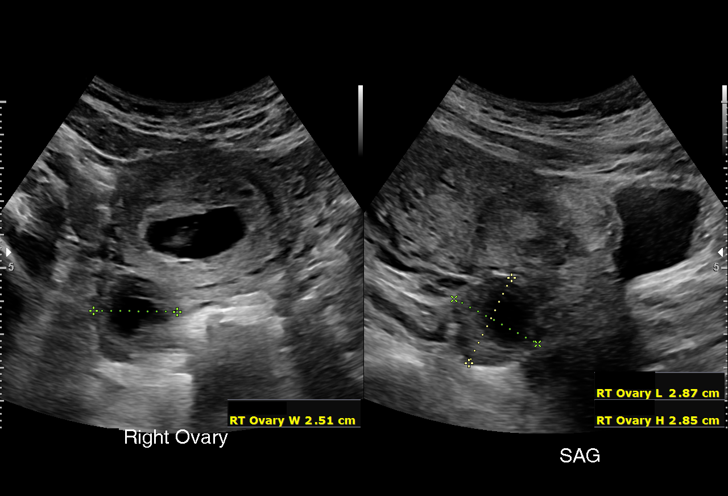
[im 32/32]
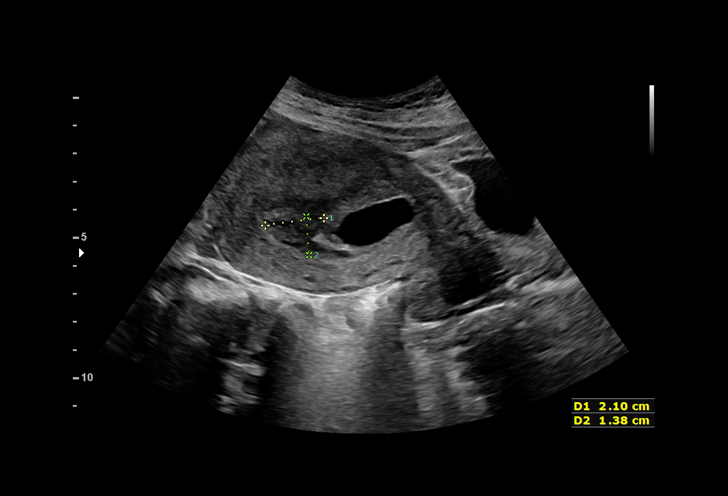

[15 of 28 positions shown; findings below may reference images not displayed]

FINDINGS: Intrauterine gestational sac: Single

Yolk sac:  Not Visualized.

Embryo:  Visualized.

Cardiac Activity: Not Visualized.

Heart Rate: 0 bpm

CRL:   15.75 mm   7 w 6 d                  US EDC: 07/26/2020

Subchorionic hemorrhage:  Moderate.

Maternal uterus/adnexae: Single intrauterine gestation with absent
fetal heart tones. Left ovary is normal and demonstrates blood flow.
Corpus luteal cyst in the right ovary which demonstrates blood flow.
No adnexal mass or pelvic free fluid.
IMPRESSION: Single intrauterine pregnancy with absent fetal heart tones.
Findings consistent with fetal demise.

## 2023-08-26 ENCOUNTER — Other Ambulatory Visit: Payer: Self-pay | Admitting: Registered Nurse

## 2023-08-26 DIAGNOSIS — N949 Unspecified condition associated with female genital organs and menstrual cycle: Secondary | ICD-10-CM

## 2023-08-27 ENCOUNTER — Other Ambulatory Visit: Payer: Self-pay | Admitting: Registered Nurse

## 2023-08-27 ENCOUNTER — Ambulatory Visit
Admission: RE | Admit: 2023-08-27 | Discharge: 2023-08-27 | Disposition: A | Discharge status: Home / Self Care | Payer: Medicaid Other | Source: Ambulatory Visit | Attending: Registered Nurse | Admitting: Registered Nurse

## 2023-08-27 DIAGNOSIS — N949 Unspecified condition associated with female genital organs and menstrual cycle: Secondary | ICD-10-CM

## 2023-09-20 ENCOUNTER — Ambulatory Visit: Payer: Medicaid Other | Attending: Registered Nurse

## 2024-09-08 ENCOUNTER — Encounter: Payer: Self-pay | Admitting: Registered Nurse

## 2024-09-08 ENCOUNTER — Other Ambulatory Visit: Payer: Self-pay | Admitting: Physician Assistant

## 2024-09-08 DIAGNOSIS — Z1231 Encounter for screening mammogram for malignant neoplasm of breast: Secondary | ICD-10-CM
